# Patient Record
Sex: Female | Born: 1995 | Race: Black or African American | Hispanic: No | Smoking: Current every day smoker
Health system: Southern US, Community
[De-identification: ages and names within clinical notes are randomized; demographics above are authoritative.]

## PROBLEM LIST (undated history)

## (undated) DIAGNOSIS — J45909 Unspecified asthma, uncomplicated: Secondary | ICD-10-CM

## (undated) DIAGNOSIS — R569 Unspecified convulsions: Secondary | ICD-10-CM

## (undated) HISTORY — PX: NASAL POLYP SURGERY: SHX186

---

## 2014-01-10 ENCOUNTER — Encounter (HOSPITAL_COMMUNITY): Payer: Self-pay | Admitting: Emergency Medicine

## 2014-01-10 ENCOUNTER — Emergency Department (HOSPITAL_COMMUNITY)
Admission: EM | Admit: 2014-01-10 | Discharge: 2014-01-10 | Disposition: A | Discharge status: Home / Self Care | Attending: Emergency Medicine | Admitting: Emergency Medicine

## 2014-01-10 DIAGNOSIS — F411 Generalized anxiety disorder: Secondary | ICD-10-CM | POA: Insufficient documentation

## 2014-01-10 DIAGNOSIS — Z79899 Other long term (current) drug therapy: Secondary | ICD-10-CM | POA: Insufficient documentation

## 2014-01-10 DIAGNOSIS — J45901 Unspecified asthma with (acute) exacerbation: Secondary | ICD-10-CM | POA: Insufficient documentation

## 2014-01-10 MED ORDER — PREDNISONE 20 MG PO TABS: ORAL_TABLET | ORAL | Status: DC

## 2014-01-10 MED ORDER — PREDNISONE 20 MG PO TABS
60.0000 mg | ORAL_TABLET | Freq: Once | ORAL | Status: AC
  Administered 2014-01-10: 60 mg via ORAL
  Filled 2014-01-10: qty 3

## 2014-01-10 MED ORDER — IPRATROPIUM BROMIDE 0.02 % IN SOLN
0.5000 mg | Freq: Once | RESPIRATORY_TRACT | Status: AC
  Administered 2014-01-10: 0.5 mg via RESPIRATORY_TRACT
  Filled 2014-01-10: qty 2.5

## 2014-01-10 MED ORDER — ALBUTEROL SULFATE HFA 108 (90 BASE) MCG/ACT IN AERS: 2.0000 | INHALATION_SPRAY | RESPIRATORY_TRACT | Status: DC | PRN

## 2014-01-10 MED ORDER — ALBUTEROL SULFATE (2.5 MG/3ML) 0.083% IN NEBU
5.0000 mg | INHALATION_SOLUTION | Freq: Once | RESPIRATORY_TRACT | Status: AC
  Administered 2014-01-10: 5 mg via RESPIRATORY_TRACT
  Filled 2014-01-10: qty 6

## 2014-01-10 NOTE — ED Provider Notes (Signed)
CSN: [id number]     Arrival date & time 01/10/14  2212 History   First MD Initiated Contact with Patient 01/10/14 2242     Chief Complaint  Patient presents with  . Shortness of Breath  . Chest Pain  . Anxiety     (Consider location/radiation/quality/duration/timing/severity/associated sxs/prior Treatment) HPI 18 year old female chronic cough chronic wheezing but worse the last week or 2 ran out of her inhaler but still has her nebulizer; it has been several months if not longer since the patient was last on oral steroids; she is no fever no chest pain no abdominal pain no vomiting or diarrhea no rash no trauma no other concerns. Treatment prior to arrival consisted occasional albuterol nebulizer. Last nebulizer was today. She does feel as if she needs another albuterol treatment now. History reviewed. No pertinent past medical history. History reviewed. No pertinent past surgical history. No family history on file. History  Substance Use Topics  . Smoking status: Never Smoker   . Smokeless tobacco: Not on file  . Alcohol Use: No   OB History   Grav Para Term Preterm Abortions TAB SAB Ect Mult Living                 Review of Systems 10 Systems reviewed and are negative for acute change except as noted in the HPI.   Allergies  Review of patient's allergies indicates no known allergies.  Home Medications   Current Outpatient Rx  Name  Route  Sig  Dispense  Refill  . albuterol (PROVENTIL HFA;VENTOLIN HFA) 108 (90 BASE) MCG/ACT inhaler   Inhalation   Inhale 1-2 puffs into the lungs every 6 (six) hours as needed for wheezing or shortness of breath.         Marland Kitchen. albuterol (PROVENTIL) (2.5 MG/3ML) 0.083% nebulizer solution   Nebulization   Take 2.5 mg by nebulization every 6 (six) hours as needed for wheezing or shortness of breath.         Marland Kitchen. ibuprofen (ADVIL,MOTRIN) 200 MG tablet   Oral   Take 200 mg by mouth daily as needed for mild pain.         Marland Kitchen. albuterol  (PROVENTIL HFA;VENTOLIN HFA) 108 (90 BASE) MCG/ACT inhaler   Inhalation   Inhale 2 puffs into the lungs every 2 (two) hours as needed for wheezing or shortness of breath (cough).   1 Inhaler   0   . predniSONE (DELTASONE) 20 MG tablet      2 tabs po daily x 4 days   8 tablet   0    BP 109/70  Pulse 82  Temp(Src) 97.9 F (36.6 C) (Oral)  Resp 16  SpO2 99% Physical Exam  Nursing note and vitals reviewed. Constitutional:  Awake, alert, nontoxic appearance.  HENT:  Head: Atraumatic.  Eyes: Right eye exhibits no discharge. Left eye exhibits no discharge.  Neck: Neck supple.  Cardiovascular: Normal rate and regular rhythm.   No murmur heard. Pulmonary/Chest: She is in respiratory distress. She has wheezes. She has no rales. She exhibits no tenderness.  Diffuse wheezes; patient speaks full sentences with pulse oximetry normal on room air at 95%; mild respiratory distress; no retractions no accessory muscle usage  Abdominal: Soft. She exhibits no distension. There is no tenderness. There is no rebound and no guarding.  Musculoskeletal: She exhibits no edema and no tenderness.  Baseline ROM, no obvious new focal weakness.  Neurological:  Mental status and motor strength appears baseline for patient and situation.  Skin: No rash noted.  Psychiatric: She has a normal mood and affect.    ED Course  Procedures (including critical care time) Patient informed of clinical course, understand medical decision-making process, and agree with plan. Labs Review Labs Reviewed - No data to display Imaging Review No results found.   EKG Interpretation    Date/Time:  Monday January 10 2014 22:23:23 EST Ventricular Rate:  101 PR Interval:  162 QRS Duration: 66 QT Interval:  326 QTC Calculation: 422 R Axis:   84 Text Interpretation:  Poor data quality, interpretation may be adversely affected Sinus tachycardia Otherwise normal ECG No previous ECGs available Confirmed by [facility]  MD,  Natash Berman ([id number]) on 01/10/2014 11:04:09 PM            MDM   Final diagnoses:  Asthma exacerbation    I doubt any other EMC precluding discharge at this time including, but not necessarily limited to the following:ACS, SBI.    Hurman Horn, MD 01/12/14 [phone]

## 2014-01-10 NOTE — ED Notes (Signed)
Pt reports that she is in town from [place]. Her and her girlfriend got an apartment but were kicked out of their lease today. States that they then tried to go to the bus station and "the taxi driver just kicked us out and it was freezing." Pt then found another cab who stated " the only where I can take you for free is the hospital". Pt tearful and upset. States she doesn't have anywhere to stay tonight. Pt also has history of asthma, reports she was unable to use neb tx. Pt has bilateral exp wheezing. NAD. Speaks in complete sentences

## 2014-01-10 NOTE — ED Notes (Signed)
Pt concerned about having a seizure. Pt takes keppra, states she has been compliant with medications. States hasn't had seizure in more then 3 months. MD Bednar at bedside evaluating.

## 2014-01-10 NOTE — ED Notes (Signed)
Pt and friend to waiting room to await taxi [facility].

## 2014-01-10 NOTE — ED Notes (Signed)
Pt presents to department for evaluation of SOB and midsternal chest pain. Onset tonight. Pt tearful and anxious upon arrival. States "I don't want to talk about it." denies SI/HI. 9/10 pain, increases with deep breathing. Respirations unlabored. Pt is alert and oriented x4.

## 2014-01-10 NOTE — Discharge Instructions (Signed)
Asthma, Adult °Asthma is a condition of the lungs in which the airways tighten and narrow. Asthma can make it hard to breathe. Asthma cannot be cured, but medicine and lifestyle changes can help control it. Asthma may be started (triggered) by: °· Animal skin flakes (dander). °· Dust. °· Cockroaches. °· Pollen. °· Mold. °· Smoke. °· Cleaning products. °· Hair sprays or aerosol sprays. °· Paint fumes or strong smells. °· Cold air, weather changes, and winds. °· Crying or laughing hard. °· Stress. °· Certain medicines or drugs. °· Foods, such as dried fruit, potato chips, and sparkling grape juice. °· Infections or conditions (colds, flu). °· Exercise. °· Certain medical conditions or diseases. °· Exercise or tiring activities. °HOME CARE  °· Take medicine as told by your doctor. °· Use a peak flow meter as told by your doctor. A peak flow meter is a tool that measures how well the lungs are working. °· Record and keep track of the peak flow meter's readings. °· Understand and use the asthma action plan. An asthma action plan is a written plan for taking care of your asthma and treating your attacks. °· To help prevent asthma attacks: °· Do not smoke. Stay away from secondhand smoke. °· Change your heating and air conditioning filter often. °· Limit your use of fireplaces and wood stoves. °· Get rid of pests (such as roaches and mice) and their droppings. °· Throw away plants if you see mold on them. °· Clean your floors. Dust regularly. Use cleaning products that do not smell. °· Have someone vacuum when you are not home. Use a vacuum cleaner with a HEPA filter if possible. °· Replace carpet with wood, tile, or vinyl flooring. Carpet can trap animal skin flakes and dust. °· Use allergy-proof pillows, mattress covers, and box spring covers. °· Wash bed sheets and blankets every week in hot water and dry them in a dryer. °· Use blankets that are made of polyester or cotton. °· Clean bathrooms and kitchens with bleach.  If possible, have someone repaint the walls in these rooms with mold-resistant paint. Keep out of the rooms that are being cleaned and painted. °· Wash hands often. °GET HELP IF: °· You have make a whistling sound when breaking (wheeze), have shortness of breath, or have a cough even if taking medicine to prevent attacks. °· The colored mucus you cough up (sputum) is thicker than usual. °· The colored mucus you cough up changes from clear or white to yellow, green, gray, or bloody. °· You have problems from the medicine you are taking such as: °· A rash. °· Itching. °· Swelling. °· Trouble breathing. °· You need reliever medicines more than 2 3 times a week. °· Your peak flow measurement is still at 50 79% of your personal best after following the action plan for 1 hour. °GET HELP RIGHT AWAY IF:  °· You seem to be worse and are not responding to medicine during an asthma attack. °· You are short of breath even at rest. °· You get short of breath when doing very little activity. °· You have trouble eating, drinking, or talking. °· You have chest pain. °· You have a fast heartbeat. °· Your lips or fingernails start to turn blue. °· You are lightheaded, dizzy, or faint. °· Your peak flow is less than 50% of your personal best. °· You have a fever or lasting symptoms for more than 2 3 days. °· You have a fever and your symptoms suddenly   get worse. °MAKE SURE YOU:  °· Understand these instructions. °· Will watch your condition. °· Will get help right away if you are not doing well or get worse. °Document Released: 04/29/2008 Document Revised: 09/01/2013 Document Reviewed: 06/10/2013 °ExitCare® Patient Information ©2014 [employer]. ° °Emergency Department Resource Guide °1) Find a Doctor and Pay Out of Pocket °Although you won't have to find out who is covered by your insurance plan, it is a good idea to ask around and get recommendations. You will then need to call the office and see if the doctor you have chosen will  accept you as a new patient and what types of options they offer for patients who are self-pay. Some doctors offer discounts or will set up payment plans for their patients who do not have insurance, but you will need to ask so you aren't surprised when you get to your appointment. ° °2) Contact Your Local Health Department °Not all health departments have doctors that can see patients for sick visits, but many do, so it is worth a call to see if yours does. If you don't know where your local health department is, you can check in your phone book. The CDC also has a tool to help you locate your state's health department, and many state websites also have listings of all of their local health departments. ° °3) Find a Walk-in Clinic °If your illness is not likely to be very severe or complicated, you may want to try a walk in clinic. These are popping up all over the country in pharmacies, drugstores, and shopping centers. They're usually staffed by nurse practitioners or physician assistants that have been trained to treat common illnesses and complaints. They're usually fairly quick and inexpensive. However, if you have serious medical issues or chronic medical problems, these are probably not your best option. ° °No Primary Care Doctor: °- Call [facility] at  832-8000 - they can help you locate a primary care doctor that  accepts your insurance, provides certain services, etc. °- Physician Referral Service- 1-800-533-3463 ° °Chronic Pain Problems: °Organization         Address  Phone   Notes  °[facility]  (336) 297-2271 Patients need to be referred by their primary care doctor.  ° °Medication Assistance: °Organization         Address  Phone   Notes  °[place] [facility] [address] °[place], [place] [address] (336) 641-8030 --Must be a resident of [place] °-- Must have NO insurance coverage whatsoever (no Medicaid/ Medicare, etc.) °-- The pt.  MUST have a primary care doctor that directs their care regularly and follows them in the community °  °[facility]  (866) 331-1348   °[employer]  (888) 892-1162   ° °Agencies that provide inexpensive medical care: °Organization         Address  Phone   Notes  °[facility]  (336) 832-8035   °[facility]    (336) 832-7272   °[facility] [address] °[place], [place] [address] (336) 832-4777   °[facility] [address], °[place] (336) 271-4999   °[facility]    (336) 373-0678   °[facility]    (336) 272-1050   °[facility] ° [address], [place] Phone:  (336) 832-4444, Fax:  (336) 832-4440 Hours of Operation:  9 am - 6 pm, M-F.    Also accepts Medicaid/Medicare and self-pay.  °[facility] ° [address], [place] Phone: (336) 832-3150, Fax: (336) 832-3151. Hours of Operation:  8:30 am - 5:30 pm, M-F.  Also accepts Medicaid and self-pay.  °[facility] [place] [address], [place] Phone: (336) 878-6027   °[facility] [address], [place], [place] (336)723-1848, Ext. 123 Mondays & Thursdays: 7-9 AM.  First 15 patients are seen on a first come, first serve basis. °  ° °Medicaid-accepting [place] Providers: ° °Organization         Address  Phone   Notes  °[facility] [address], [place] (336) 641-2100 Also accepts self-pay patients.  °[facility] [address], [place] ° (336) 856-9996   °[facility] [address], [place] (336) 288-8857   °[facility] [address] [place] [address], [place] (336) 299-7000   °Veita Bland [address], [place]  ° (336) 373-1557 Only accepts [place] Access Medicaid patients after they have their name applied to their card.  ° °Self-Pay (no insurance) in  [place]: ° °Organization         Address  Phone   Notes  °Sickle Cell Patients, [facility] [address], [place] (336) 832-1970   °[facility] [address], [place] (336) 832-4400   °[facility] ° [address], [place] (336) 992-4800   °[facility]/Dr. Osei-Bonsu ° [address] [place] [address], [place] or [address], [place] (336) 841-8500 Phone number for both [place] and [place] locations is the same.  °[facility] [address], [place] (336) 299-0000   °[facility] [address] [place] [address], [place] or [address] (336) 852-7530 °(336) 878-2260   °[facility] [address], [place] (336) 350-1642, phone; (336) 294-5005, fax Sees patients 1st and 3rd Saturday of every month.  Must not qualify for public or private insurance (i.e. Medicaid, Medicare, [place] Health Choice, Veterans' Benefits) • Household income should be no more than 200% of the poverty level •The clinic cannot treat you if you are pregnant or think you are pregnant • Sexually transmitted diseases are not treated at the clinic.  ° ° °Dental Care: °Organization         Address  Phone  Notes  °[facility] [address], [place] (336) 641-6152 Accepts children up to age 21 who are enrolled in Medicaid or [place] Health Choice; pregnant women with a Medicaid card; and children who have applied for Medicaid or [place] Health Choice, but were declined, whose parents can pay a reduced fee at time of service.  °[facility] [place]  [address], [place] (336) 641-7733 Accepts children up to age 21 who are enrolled in Medicaid or [place] Health Choice; pregnant women with a Medicaid card; and children who have applied for Medicaid or [place] Health Choice, but were declined, whose  parents can pay a reduced fee at time of service.  °[facility] ° [address], [place] (336) 641-4533 Patients are seen by appointment only. Walk-ins are not accepted. [facility] will see patients 18 years of age and older. °Monday - Tuesday (  8am-5pm) °Most Wednesdays (8:30-5pm) °$30 per visit, cash only  °[facility] ° [address], [place] (336) 641-4533 Patients are seen by appointment only. Walk-ins are not accepted. [facility] will see patients 18 years of age and older. °One Wednesday Evening (Monthly: Volunteer Based).  $30 per visit, cash only  °[facility]  (919) 537-3737 for adults; Children under age 4, call Graduate Pediatric Dentistry at (919) [phone]. Children aged 4-14, please call (919) [phone] to request a pediatric application. ° Dental services are provided in all areas of dental care including fillings, crowns and bridges, complete and partial dentures, implants, gum treatment, root canals, and extractions. Preventive care is also provided. Treatment is provided to both adults and children. °Patients are selected via a lottery and there is often a waiting list. °  °[facility] [address], °[place] ° (336) 763-8833 [email or web address] °  °[facility] [address], [place], [place] (336)723-1848, Ext. 123 Second and Fourth Thursday of each month, opens at 6:30 AM; Clinic ends at 9 AM.  Patients are seen on a first-come first-served basis, and a limited number are seen during each clinic.  ° °[facility] ° [address], [place], [place] (336) 723-7904   Eligibility Requirements °You must have lived in [place], [place], or [place] counties for at least the last three months. °  You cannot be eligible for state or federal sponsored healthcare insurance, including Veterans Administration, Medicaid, or Medicare. °  You generally cannot be eligible for  healthcare insurance through your employer.  °  How to apply: °Eligibility screenings are held every Tuesday and Wednesday afternoon from 1:00 pm until 4:00 pm. You do not need an appointment for the interview!  °[facility] [address], [place], [place] 336-631-2330   °[facility]  336-342-8273   °[facility]  336-703-3100   °[facility]  336-570-6415   ° °Behavioral Health Resources in the Community: °Intensive Outpatient Programs °Organization         Address  Phone  Notes  °[facility] [address], [place], [place] 336-878-6098   °[facility] [address], [place], [place] 336-832-9800   °[facility] [address], [place], [place] ° 336-882-2125   °[facility] [address],  °[place], [place] 1-800-853-5163 or 336-641-4981   °Substance Abuse Resources °Organization         Address  Phone  Notes  °[facility]  336-882-2125   °[facility]  336-784-9470   °[facility]  336-285-9073   °[facility]  336-845-3988   °Residential & Outpatient Substance Abuse Program  1-800-659-3381   °Psychological Services °Organization         Address  Phone  Notes  °[facility]  336- 832-9600   °[facility]  336- 378-7881   °[facility] [address], [place] 1-800-853-5163 or 336-641-4981   ° °Mobile Crisis Teams °Organization         Address  Phone  Notes  °[facility], Mobile Crisis Care Unit  1-877-626-1772   °Assertive °[facility] ° [address] [place], [place] 336-834-9664   °Sharon DeEsch [address] ° [place] 336-554-5454   ° °Self-Help/Support Groups °Organization         Address  Phone               Notes  °[facility] - variety of support groups  336- 373-1402 Call for more information  °Narcotics  Anonymous (NA), [facility] [address], °[place]  2 meetings at this location  ° °Residential Treatment Programs °Organization         Address  Phone  Notes  °[facility] [address],    °[place]  1-866-801-8205   °[facility] ° [address], [place], [place] 704-293-8524   °[facility] [address], [place] 336-845-3988 Admissions: 8am-3pm M-F  °[facility] [address]    °[place], [place] 336-841-1104   °[facility] [address], [place], [place] 336-379-7146   °[facility] [address]  °[place], [place] 336-285-9073   °[facility] Intensive Outpatient [address], [place], [place] 336-852-3033   °[facility] [address]  °[place], [place] 1-877-615-2722 or 336-784-9470   °[facility] [address] [place], [place] 336-227-7417 Accepts Medicaid  °[facility] [address]  °[place] 1-800-659-3381 Substance Abuse/Addiction Treatment  ° °[place] Behavioral Health Resources °Organization         Address  Phone  Notes  °[facility]  (888) 581-9988   °Julie Brannon, PhD [address] [place], [place]   (336) 349-5553 or (336) 951-0000   °[facility]   [address] °[place], [place] (336) 349-4454   °[facility] [address], [place], [place] (336) 342-8316 Insurance/Medicaid/sponsorship through [facility]  °[facility] [address]                                    [place], [place] (336) 342-8316 Therapy/tele-psych/case  °[facility] [address]  ° [place], [place] (336) 349-2233    °Dr. Arfeen  (336) 349-4544   °[facility] [place]  [employer] [place] [facility] 1) [address],  °2) [address], [place] °3)  [address], [place] (336) 349-3220 °(336)  342-7768 ° °(336) 342-8140   °[place] [facility] Abuse [facility] (336) 342-1394 or (336) 342-3537 (After Hours)    ° ° ° °

## 2014-01-28 ENCOUNTER — Emergency Department (HOSPITAL_COMMUNITY)
Admission: EM | Admit: 2014-01-28 | Discharge: 2014-01-28 | Disposition: A | Discharge status: Home / Self Care | Attending: Emergency Medicine | Admitting: Emergency Medicine

## 2014-01-28 ENCOUNTER — Encounter (HOSPITAL_COMMUNITY): Payer: Self-pay | Admitting: Emergency Medicine

## 2014-01-28 DIAGNOSIS — F17200 Nicotine dependence, unspecified, uncomplicated: Secondary | ICD-10-CM | POA: Insufficient documentation

## 2014-01-28 DIAGNOSIS — R079 Chest pain, unspecified: Secondary | ICD-10-CM

## 2014-01-28 DIAGNOSIS — Z79899 Other long term (current) drug therapy: Secondary | ICD-10-CM | POA: Insufficient documentation

## 2014-01-28 DIAGNOSIS — J45901 Unspecified asthma with (acute) exacerbation: Secondary | ICD-10-CM | POA: Insufficient documentation

## 2014-01-28 HISTORY — DX: Unspecified asthma, uncomplicated: J45.909

## 2014-01-28 MED ORDER — ALBUTEROL SULFATE HFA 108 (90 BASE) MCG/ACT IN AERS
2.0000 | INHALATION_SPRAY | RESPIRATORY_TRACT | Status: DC | PRN
  Administered 2014-01-28: 2 via RESPIRATORY_TRACT
  Filled 2014-01-28: qty 6.7

## 2014-01-28 MED ORDER — ALBUTEROL SULFATE (2.5 MG/3ML) 0.083% IN NEBU
5.0000 mg | INHALATION_SOLUTION | Freq: Once | RESPIRATORY_TRACT | Status: AC
  Administered 2014-01-28: 5 mg via RESPIRATORY_TRACT
  Filled 2014-01-28: qty 6

## 2014-01-28 NOTE — ED Notes (Signed)
C/o pain in center of chest and sob/wheezing x 2 hours.  States she lives in a shelter and has to be out of the shelter by a certain time.  States she was unable to get back in shelter to get her albuterol.

## 2014-01-28 NOTE — Discharge Instructions (Signed)
Asthma, Adult Asthma is a condition of the lungs in which the airways tighten and narrow. Asthma can make it hard to breathe. Asthma cannot be cured, but medicine and lifestyle changes can help control it. Asthma may be started (triggered) by:  Animal skin flakes (dander).  Dust.  Cockroaches.  Pollen.  Mold.  Smoke.  Cleaning products.  Hair sprays or aerosol sprays.  Paint fumes or strong smells.  Cold air, weather changes, and winds.  Crying or laughing hard.  Stress.  Certain medicines or drugs.  Foods, such as dried fruit, potato chips, and sparkling grape juice.  Infections or conditions (colds, flu).  Exercise.  Certain medical conditions or diseases.  Exercise or tiring activities. HOME CARE   Take medicine as told by your doctor.  Use a peak flow meter as told by your doctor. A peak flow meter is a tool that measures how well the lungs are working.  Record and keep track of the peak flow meter's readings.  Understand and use the asthma action plan. An asthma action plan is a written plan for taking care of your asthma and treating your attacks.  To help prevent asthma attacks:  Do not smoke. Stay away from secondhand smoke.  Change your heating and air conditioning filter often.  Limit your use of fireplaces and wood stoves.  Get rid of pests (such as roaches and mice) and their droppings.  Throw away plants if you see mold on them.  Clean your floors. Dust regularly. Use cleaning products that do not smell.  Have someone vacuum when you are not home. Use a vacuum cleaner with a HEPA filter if possible.  Replace carpet with wood, tile, or vinyl flooring. Carpet can trap animal skin flakes and dust.  Use allergy-proof pillows, mattress covers, and box spring covers.  Wash bed sheets and blankets every week in hot water and dry them in a dryer.  Use blankets that are made of polyester or cotton.  Clean bathrooms and kitchens with bleach.  If possible, have someone repaint the walls in these rooms with mold-resistant paint. Keep out of the rooms that are being cleaned and painted.  Wash hands often. GET HELP IF:  You have make a whistling sound when breaking (wheeze), have shortness of breath, or have a cough even if taking medicine to prevent attacks.  The colored mucus you cough up (sputum) is thicker than usual.  The colored mucus you cough up changes from clear or white to yellow, green, gray, or bloody.  You have problems from the medicine you are taking such as:  A rash.  Itching.  Swelling.  Trouble breathing.  You need reliever medicines more than 2 3 times a week.  Your peak flow measurement is still at 50 79% of your personal best after following the action plan for 1 hour. GET HELP RIGHT AWAY IF:   You seem to be worse and are not responding to medicine during an asthma attack.  You are short of breath even at rest.  You get short of breath when doing very little activity.  You have trouble eating, drinking, or talking.  You have chest pain.  You have a fast heartbeat.  Your lips or fingernails start to turn blue.  You are lightheaded, dizzy, or faint.  Your peak flow is less than 50% of your personal best.  You have a fever or lasting symptoms for more than 2 3 days.  You have a fever and your symptoms suddenly  get worse. MAKE SURE YOU:   Understand these instructions.  Will watch your condition.  Will get help right away if you are not doing well or get worse. Document Released: 04/29/2008 Document Revised: 09/01/2013 Document Reviewed: 06/10/2013 [facility] Patient Information 2014 [place].  Asthma Attack Prevention Although there is no way to prevent asthma from starting, you can take steps to control the disease and reduce its symptoms. Learn about your asthma and how to control it. Take an active role to control your asthma by working with your health care provider to  create and follow an asthma action plan. An asthma action plan guides you in:  Taking your medicines properly.  Avoiding things that set off your asthma or make your asthma worse (asthma triggers).  Tracking your level of asthma control.  Responding to worsening asthma.  Seeking emergency care when needed. To track your asthma, keep records of your symptoms, check your peak flow number using a handheld device that shows how well air moves out of your lungs (peak flow meter), and get regular asthma checkups.  WHAT ARE SOME WAYS TO PREVENT AN ASTHMA ATTACK?  Take medicines as directed by your health care provider.  Keep track of your asthma symptoms and level of control.  With your health care provider, write a detailed plan for taking medicines and managing an asthma attack. Then be sure to follow your action plan. Asthma is an ongoing condition that needs regular monitoring and treatment.  Identify and avoid asthma triggers. Many outdoor allergens and irritants (such as pollen, mold, cold air, and air pollution) can trigger asthma attacks. Find out what your asthma triggers are and take steps to avoid them.  Monitor your breathing. Learn to recognize warning signs of an attack, such as coughing, wheezing, or shortness of breath. Your lung function may decrease before you notice any signs or symptoms, so regularly measure and record your peak airflow with a home peak flow meter.  Identify and treat attacks early. If you act quickly, you are less likely to have a severe attack. You will also need less medicine to control your symptoms. When your peak flow measurements decrease and alert you to an upcoming attack, take your medicine as instructed and immediately stop any activity that may have triggered the attack. If your symptoms do not improve, get medical help.  Pay attention to increasing quick-relief inhaler use. If you find yourself relying on your quick-relief inhaler, your asthma is  not under control. See your health care provider about adjusting your treatment. WHAT CAN MAKE MY SYMPTOMS WORSE? A number of common things can set off or make your asthma symptoms worse and cause temporary increased inflammation of your airways. Keep track of your asthma symptoms for several weeks, detailing all the environmental and emotional factors that are linked with your asthma. When you have an asthma attack, go back to your asthma diary to see which factor, or combination of factors, might have contributed to it. Once you know what these factors are, you can take steps to control many of them. If you have allergies and asthma, it is important to take asthma prevention steps at home. Minimizing contact with the substance to which you are allergic will help prevent an asthma attack. Some triggers and ways to avoid these triggers are: Animal Dander:  Some people are allergic to the flakes of skin or dried saliva from animals with fur or feathers.   There is no such thing as a hypoallergenic dog  or cat breed. All dogs or cats can cause allergies, even if they don't shed.  Keep these pets out of your home.  If you are not able to keep a pet outdoors, keep the pet out of your bedroom and other sleeping areas at all times, and keep the door closed.  Remove carpets and furniture covered with cloth from your home. If that is not possible, keep the pet away from fabric-covered furniture and carpets. Dust Mites: Many people with asthma are allergic to dust mites. Dust mites are tiny bugs that are found in every home in mattresses, pillows, carpets, fabric-covered furniture, bedcovers, clothes, stuffed toys, and other fabric-covered items.   Cover your mattress in a special dust-proof cover.  Cover your pillow in a special dust-proof cover, or wash the pillow each week in hot water. Water must be hotter than 130 F (54.4 C) to kill dust mites. Cold or warm water used with detergent and bleach can  also be effective.  Wash the sheets and blankets on your bed each week in hot water.  Try not to sleep or lie on cloth-covered cushions.  Call ahead when traveling and ask for a smoke-free hotel room. Bring your own bedding and pillows in case the hotel only supplies feather pillows and down comforters, which may contain dust mites and cause asthma symptoms.  Remove carpets from your bedroom and those laid on concrete, if you can.  Keep stuffed toys out of the bed, or wash the toys weekly in hot water or cooler water with detergent and bleach. Cockroaches: Many people with asthma are allergic to the droppings and remains of cockroaches.   Keep food and garbage in closed containers. Never leave food out.  Use poison baits, traps, powders, gels, or paste (for example, boric acid).  If a spray is used to kill cockroaches, stay out of the room until the odor goes away. Indoor Mold:  Fix leaky faucets, pipes, or other sources of water that have mold around them.  Clean floors and moldy surfaces with a fungicide or diluted bleach.  Avoid using humidifiers, vaporizers, or swamp coolers. These can spread molds through the air. Pollen and Outdoor Mold:  When pollen or mold spore counts are high, try to keep your windows closed.  Stay indoors with windows closed from late morning to afternoon. Pollen and some mold spore counts are highest at that time.  Ask your health care provider whether you need to take anti-inflammatory medicine or increase your dose of the medicine before your allergy season starts. Other Irritants to Avoid:  Tobacco smoke is an irritant. If you smoke, ask your health care provider how you can quit. Ask family members to quit smoking too. Do not allow smoking in your home or car.  If possible, do not use a wood-burning stove, kerosene heater, or fireplace. Minimize exposure to all sources of smoke, including to incense, candles, fires, and fireworks.  Try to stay  away from strong odors and sprays, such as perfume, talcum powder, hair spray, and paints.  Decrease humidity in your home and use an indoor air cleaning device. Reduce indoor humidity to below 60%. Dehumidifiers or central air conditioners can do this.  Decrease house dust exposure by changing furnace and air cooler filters frequently.  Try to have someone else vacuum for you once or twice a week. Stay out of rooms while they are being vacuumed and for a short while afterward.  If you vacuum, use a dust mask from  a hardware store, a double-layered or microfilter vacuum cleaner bag, or a vacuum cleaner with a HEPA filter.  Sulfites in foods and beverages can be irritants. Do not drink beer or wine or eat dried fruit, processed potatoes, or shrimp if they cause asthma symptoms.  Cold air can trigger an asthma attack. Cover your nose and mouth with a scarf on cold or windy days.  Several health conditions can make asthma more difficult to manage, including a runny nose, sinus infections, reflux disease, psychological stress, and sleep apnea. Work with your health care provider to manage these conditions.  Avoid close contact with people who have a respiratory infection such as a cold or the flu, since your asthma symptoms may get worse if you catch the infection. Wash your hands thoroughly after touching items that may have been handled by people with a respiratory infection.  Get a flu shot every year to protect against the flu virus, which often makes asthma worse for days or weeks. Also get a pneumonia shot if you have not previously had one. Unlike the flu shot, the pneumonia shot does not need to be given yearly. Medicines:  Talk to your health care provider about whether it is safe for you to take aspirin or non-steroidal anti-inflammatory medicines (NSAIDs). In a small number of people with asthma, aspirin and NSAIDs can cause asthma attacks. These medicines must be avoided by people who  have known aspirin-sensitive asthma. It is important that people with aspirin-sensitive asthma read labels of all over-the-counter medicines used to treat pain, colds, coughs, and fever.  Beta blockers and ACE inhibitors are other medicines you should discuss with your health care provider. HOW CAN I FIND OUT WHAT I AM ALLERGIC TO? Ask your asthma health care provider about allergy skin testing or blood testing (the RAST test) to identify the allergens to which you are sensitive. If you are found to have allergies, the most important thing to do is to try to avoid exposure to any allergens that you are sensitive to as much as possible. Other treatments for allergies, such as medicines and allergy shots (immunotherapy) are available.  CAN I EXERCISE? Follow your health care provider's advice regarding asthma treatment before exercising. It is important to maintain a regular exercise program, but vigorous exercise, or exercise in cold, humid, or dry environments can cause asthma attacks, especially for those people who have exercise-induced asthma. Document Released: 10/30/2009 Document Revised: 07/14/2013 Document Reviewed: 05/19/2013 [facility] Patient Information 2014 [place], [place].  Emergency Department Resource Guide 1) Find a Doctor and Pay Out of Pocket Although you won't have to find out who is covered by your insurance plan, it is a good idea to ask around and get recommendations. You will then need to call the office and see if the doctor you have chosen will accept you as a new patient and what types of options they offer for patients who are self-pay. Some doctors offer discounts or will set up payment plans for their patients who do not have insurance, but you will need to ask so you aren't surprised when you get to your appointment.  2) Contact Your Local Health Department Not all health departments have doctors that can see patients for sick visits, but many do, so it is worth a call to  see if yours does. If you don't know where your local health department is, you can check in your phone book. The CDC also has a tool to help you locate your state's  health department, and many state websites also have listings of all of their local health departments.  3) Find a Walk-in Clinic If your illness is not likely to be very severe or complicated, you may want to try a walk in clinic. These are popping up all over the country in pharmacies, drugstores, and shopping centers. They're usually staffed by nurse practitioners or physician assistants that have been trained to treat common illnesses and complaints. They're usually fairly quick and inexpensive. However, if you have serious medical issues or chronic medical problems, these are probably not your best option.  No Primary Care Doctor: - Call Health Connect at  [phone] - they can help you locate a primary care doctor that  accepts your insurance, provides certain services, etc. - Physician Referral Service- [phone]  Chronic Pain Problems: Organization         Address  Phone   Notes  [facility]  [phone] Patients need to be referred by their primary care doctor.   Medication Assistance: Organization         Address  Phone   Notes  [facility]ication [facility] [address] [place], [place] [address] [phone] --Must be a resident of [place] -- Must have NO insurance coverage whatsoever (no Medicaid/ Medicare, etc.) -- The pt. MUST have a primary care doctor that directs their care regularly and follows them in the community   [facility]  [phone]   [employer]  [phone]    Agencies that provide inexpensive medical care: Organization         Address                                                       Phone                                                                            Notes  [facility]  [phone]   [facility]    [phone]   [facility] [address] [place], [place] [address] [phone]   [facility] [address], [place] [phone]   [facility]    [phone]   [facility]    [phone]) [phone]   [facility]  [address], [place] Phone:  [phone], Fax:  [phone] Hours of Operation:  9 am - 6 pm, M-F.  Also accepts Medicaid/Medicare and self-pay.  [facility]  [address], [place] Phone: [phone], Fax: [phone]. Hours of Operation:  8:30 am - 5:30 pm, M-F.  Also accepts Medicaid and self-pay.  [facility] [place] [address], [place] Phone: [phone]   [facility] [address], [place], [place] [phone], Ext. [phone] Mondays & Thursdays: 7-9 AM.  First 15 patients are seen on a first come, first serve basis.    Medicaid-accepting  [facility] Providers:  Organization         Address                                                                       Phone                               Notes  [facility] [address], [place] [phone] Also accepts self-pay patients.  [facility] [address] Laurell Josephs [place], [place]  [phone]   [facility] [address], [place] [phone]   [facility] [address], [place] [phone]   Renaye Rakers [address], [place]   [phone] Only accepts [place] patients after they have their name applied to their card.   Self-Pay (no insurance) in [facility]:   Organization         Address                                                     Phone               Notes  Sickle Cell Patients, [facility] [address] [place], [place] [phone]     [facility] [address] [place], [place] [phone]   [facility] [place]  [address], [place] [phone]   [facility]/Dr. Osei-Bonsu  [address] [place] or [address], [place] [phone] Phone number for both [place] and [place] locations is the same.  [facility] [address], [place] [phone]   [facility] [address] [place] or [address] [phone]   [facility] [address], [place] [phone], phone; [phone], fax Sees patients 1st and 3rd Saturday of every month.  Must not qualify for public or private insurance (i.e. Medicaid, Medicare, [place] Health Choice, Veterans' Benefits)  Household income should be no more than 200% of the poverty level The clinic cannot treat you if you are pregnant or think you are pregnant  Sexually transmitted diseases are not treated at the clinic.    Dental Care: Organization         Address                                  Phone                       Notes  [facility] [address] [place], [place] [phone] Accepts children up to age 65 who are enrolled in [place] or [place] Health Choice; pregnant women with a Medicaid card; and children who  have applied for Medicaid or [place] Health Choice, but were declined, whose parents can pay a reduced fee at time of service.  [facility]  [address], [place] [phone] Accepts children up to age 80 who are enrolled in [place] or [place] Health Choice; pregnant women with a Medicaid card; and children who have applied for Medicaid or [place] Health Choice, but were declined, whose parents can pay a reduced fee at time of service.  [facility]  [address]  [place], [place] [phone] Patients are seen by appointment only. Walk-ins are not accepted. [facility] will see patients 28 years of age and older. Monday - Tuesday (8am-5pm) Most Wednesdays (8:30-5pm) $30 per visit, cash only  [facility]  [address], [facility] [phone] Patients are seen by appointment only. Walk-ins are not accepted. [facility] will see patients 58 years of age and older. One Wednesday Evening (Monthly: Volunteer Based).  $30 per visit, cash only  [employer]  [phone] for adults; Children under age 72, call Graduate Pediatric Dentistry at [phone]. Children aged 40-14, please call [phone] to request a pediatric application.  Dental services are provided in all areas of dental care including fillings, crowns and bridges, complete and partial dentures, implants, gum treatment, root canals, and extractions. Preventive care is also provided. Treatment is provided to both adults and children. Patients are selected via a lottery and there is often a waiting list.   [facility] [address], [place]  [phone] [email or web address]   [facility] [address] [place], [place] [phone], Ext. [phone] Second and Fourth Thursday of each month, opens at 6:30 AM; Clinic ends at 9 AM.  Patients are seen on a first-come first-served basis, and a limited number are seen during each clinic.   [facility]  [address] [place]ins [place], [place] [phone]   Eligibility Requirements You must have lived in [place], [place], or [place] counties for at least the last three months.   You cannot be eligible for state or federal sponsored [place], including [employer], [place], or [employer].   You generally cannot be eligible for healthcare insurance through your employer.    How to apply: Eligibility screenings are held every Tuesday and  Wednesday afternoon from 1:00 pm until 4:00 pm. You do not need an appointment for the interview!  [facility] [address], [place], [place] [phone]   [facility]  [phone]   [facility]  [phone]   [facility]  [phone]    Behavioral Health Resources in the Community: Intensive Outpatient Programs Organization         Address                                              Phone              Notes  [facility] [address] [place], [place] [phone]   [facility] [address] [place], [place] [phone]   [facility] [address] [place], [place]  [phone]   [facility] [address],  [place], [place] [phone] or [phone]   Substance Abuse Resources Organization         Address                                Phone  Notes  [facility]  [phone]   [facility]  [phone]   [facility]  [phone]   Floydene Flock  [phone]   Residential & Outpatient Substance Abuse Program  [phone]   Psychological Services Organization         Address                                  Phone                Notes  [facility]  [phone]   [facility]  [phone]   [facility] [address] [place] [phone] or [phone]    Mobile Crisis Teams Organization         Address  Phone  Notes  [facility]rapeutic [facility], Mobile Crisis Care Unit  [phone]   [facility]  [address] [place], [place] [phone]   Doristine Locks [address] [place] [phone]    Self-Help/Support Groups Organization         Address                         Phone             Notes  [facility] [place] - variety of support groups  336- [phone]  Call for more information  Narcotics Anonymous (NA), [facility] [address], [employer] [place]  2 meetings at this location   Statistician         Address                                                    Phone              Notes  [facility] [address],    [place]  [phone]   [facility]  [address] [place] [address], [place], [place] [phone]   [facility] [address] [place], [place] [phone] Admissions: 8am-3pm M-F  [facility] [address]    [place], [place] [phone]   [facility] [address] [place], [place], [place] [phone]   [facility] [address],  [place], [place] [phone]   [facility] Intensive Outpatient [address], [place], [place] [phone]   [facility] ([facility] [address] [place]  [place], [place] [phone] or [phone]   [facility] [address] [place], [place] [phone] Accepts Medicaid  [facility] [address]  [place] [phone] Substance Abuse/Addiction Treatment   [facility] Resources Organization         Address  Phone                    Notes  [facility]  [phone]   Angie Fava, PhD [address] [place], [place]   [phone] or [phone]   [facility]   [address] [place], [place] [phone]   [facility] [address] [place], [place] [phone] Insurance/Medicaid/sponsorship through [facility] and Families [address]                                    [place], [place] [phone] Therapy/tele-psych/case  [facility] [address], [place] [phone]    Dr. Lolly Mustache  [phone]   [facility]  [employer] [facility] Dept. 1) [address] [place] 2) [address], [place] 3)  [address], [place] [phone]  [phone]   [facility] Child Abuse Hotline [phone] or [phone] (After Hours)

## 2014-01-28 NOTE — ED Notes (Signed)
Pt verbalized understanding of discharge instruction. E-signature pad not working.

## 2014-01-28 NOTE — ED Provider Notes (Signed)
CSN: [id number]     Arrival date & time 01/28/14  2010 History  This chart was scribed for Junius FinnerErin O'Malley, PA, working with Shelda JakesScott W. Zackowski, MD, by Ardelia Memsylan [place] ED Scribe. This patient was seen in room TR09C/TR09C and the patient's care was started at 9:24 PM.  Chief Complaint  Patient presents with  . Chest Pain  . Asthma    The history is provided by the patient. No language interpreter was used.    HPI Comments: Ana Clarke is a 18 y.o. female with a history of asthma who presents to the Emergency Department complaining of "achy" center chest pain onset 2 hours ago, 2/10. She reports associated SOB and wheezing. She suspects that she was havng an asthma attack. She states that she has been prescribed an albuterol nebulizer but did not have it with her today. She reports that she was given a breathing treatment upon arrival which offered significant relief of her symptoms. She denies fever, nausea, vomiting or any other symptoms. Denies recent illness or sick contacts. No hx of CAD. No hx of blood clots, long travel, or birth control.    Past Medical History  Diagnosis Date  . Asthma    Past Surgical History  Procedure Laterality Date  . Nasal polyp surgery     No family history on file. History  Substance Use Topics  . Smoking status: Current Every Day Smoker  . Smokeless tobacco: Not on file  . Alcohol Use: Yes   OB History   Grav Para Term Preterm Abortions TAB SAB Ect Mult Living                 Review of Systems  Constitutional: Negative for fever.  Respiratory: Positive for chest tightness, shortness of breath and wheezing.   Gastrointestinal: Negative for nausea and vomiting.  All other systems reviewed and are negative.   Allergies  Review of patient's allergies indicates no known allergies.  Home Medications   Current Outpatient Rx  Name  Route  Sig  Dispense  Refill  . albuterol (PROVENTIL HFA;VENTOLIN HFA) 108 (90 BASE) MCG/ACT inhaler    Inhalation   Inhale 2 puffs into the lungs every 2 (two) hours as needed for wheezing or shortness of breath (cough).   1 Inhaler   0   . albuterol (PROVENTIL) (2.5 MG/3ML) 0.083% nebulizer solution   Nebulization   Take 2.5 mg by nebulization every 6 (six) hours as needed for wheezing or shortness of breath.          Triage Vitals: BP 106/66  Pulse 99  Temp(Src) 99.6 F (37.6 C) (Oral)  Resp 18  Ht 5\' 7"  (1.702 m)  Wt 125 lb (56.7 kg)  BMI 19.57 kg/m2  SpO2 100%  LMP 01/12/2014  Physical Exam  Nursing note and vitals reviewed. Constitutional: She appears well-developed and well-nourished. No distress.  HENT:  Head: Normocephalic and atraumatic.  Eyes: Conjunctivae are normal. No scleral icterus.  Neck: Normal range of motion.  Cardiovascular: Normal rate, regular rhythm and normal heart sounds.   Pulmonary/Chest: Effort normal and breath sounds normal. No respiratory distress. She has no wheezes. She has no rales. She exhibits no tenderness.  No respiratory distress. Able to speak in full sentences. Lungs CTA bilaterally.   Abdominal: Soft. Bowel sounds are normal. She exhibits no distension and no mass. There is no tenderness. There is no rebound and no guarding.  Soft, non-tender.  Musculoskeletal: Normal range of motion.  Neurological: She is alert.  Skin: Skin is warm and dry. She is not diaphoretic.    ED Course  Procedures (including critical care time)  DIAGNOSTIC STUDIES: Oxygen Saturation is 100% on RA, normal by my interpretation.    COORDINATION OF CARE: 9:27 PM- Pt advised of plan for treatment and pt agrees.  Medications  albuterol (PROVENTIL HFA;VENTOLIN HFA) 108 (90 BASE) MCG/ACT inhaler 2 puff (2 puffs Inhalation Given 01/28/14 2131)  albuterol (PROVENTIL) (2.5 MG/3ML) 0.083% nebulizer solution 5 mg (5 mg Nebulization Given 01/28/14 2025)   Labs Review Labs Reviewed - No data to display Imaging Review No results found.   EKG  Interpretation None      MDM   Final diagnoses:  Asthma exacerbation  Chest pain    Pt c/o asthma exacerbation. States she did not have her albuterol with her.  Pt given albuterol neb tx in triage which provided significant relief.  Vitals: O2-100% on RA.  Pt appears well, no respiratory distress. Lungs: CTAB.  Do not believe CXR or cardiac workup would be beneficial at this time. Not concerned for ACS, PE, pneumothorax, or pneumonia.  Rx: albuterol inhaler. Advised to f/u with PCP. Resource guide provided. Return precautions provided. Pt verbalized understanding and agreement with tx plan.   I personally performed the services described in this documentation, which was scribed in my presence. The recorded information has been reviewed and is accurate.    Junius Finner, PA-C 01/28/14 2252

## 2014-01-29 NOTE — ED Provider Notes (Signed)
Medical screening examination/treatment/procedure(s) were performed by non-physician practitioner and as supervising physician I was immediately available for consultation/collaboration.   EKG Interpretation None        Shelda JakesScott W. Arabela Basaldua, MD 01/29/14 [phone]

## 2014-02-14 ENCOUNTER — Emergency Department (HOSPITAL_COMMUNITY)
Admission: EM | Admit: 2014-02-14 | Discharge: 2014-02-14 | Disposition: A | Discharge status: Home / Self Care | Payer: TRICARE For Life (TFL) | Attending: Emergency Medicine | Admitting: Emergency Medicine

## 2014-02-14 ENCOUNTER — Encounter (HOSPITAL_COMMUNITY): Payer: Self-pay | Admitting: Emergency Medicine

## 2014-02-14 DIAGNOSIS — Z8669 Personal history of other diseases of the nervous system and sense organs: Secondary | ICD-10-CM | POA: Insufficient documentation

## 2014-02-14 DIAGNOSIS — Z79899 Other long term (current) drug therapy: Secondary | ICD-10-CM | POA: Insufficient documentation

## 2014-02-14 DIAGNOSIS — R0789 Other chest pain: Secondary | ICD-10-CM

## 2014-02-14 DIAGNOSIS — F17200 Nicotine dependence, unspecified, uncomplicated: Secondary | ICD-10-CM | POA: Insufficient documentation

## 2014-02-14 DIAGNOSIS — M940 Chondrocostal junction syndrome [Tietze]: Secondary | ICD-10-CM | POA: Insufficient documentation

## 2014-02-14 DIAGNOSIS — J45909 Unspecified asthma, uncomplicated: Secondary | ICD-10-CM | POA: Insufficient documentation

## 2014-02-14 DIAGNOSIS — IMO0002 Reserved for concepts with insufficient information to code with codable children: Secondary | ICD-10-CM | POA: Insufficient documentation

## 2014-02-14 HISTORY — DX: Unspecified convulsions: R56.9

## 2014-02-14 MED ORDER — NAPROXEN 500 MG PO TABS: 500.0000 mg | ORAL_TABLET | Freq: Two times a day (BID) | ORAL | Status: DC

## 2014-02-14 MED ORDER — KETOROLAC TROMETHAMINE 60 MG/2ML IM SOLN
60.0000 mg | Freq: Once | INTRAMUSCULAR | Status: AC
  Administered 2014-02-14: 60 mg via INTRAMUSCULAR
  Filled 2014-02-14: qty 2

## 2014-02-14 NOTE — ED Notes (Signed)
Pt given information for private MD, has [employer]

## 2014-02-14 NOTE — ED Provider Notes (Signed)
CSN: [id number]     Arrival date & time 02/14/14  [id number] History   First MD Initiated Contact with Patient 02/14/14 [phone]     Chief Complaint  Patient presents with  . Chest Pain     (Consider location/radiation/quality/duration/timing/severity/associated sxs/prior Treatment) HPI Comments: Patient is a 18 year old female with past medical history of asthma and seizures who presents to the emergency department complaining of midsternal chest pain free across both sides of her chest x 5 days. Pain is constant with intermittent sharp pains, worse with palpation and deep inspiration. Symptoms worse while she takes her nebulizer breathing treatments. Denies wheezing, cough or shortness of breath. He had a recent asthma exacerbation earlier this month. Denies fever or chills.  Patient is a 18 y.o. female presenting with chest pain. The history is provided by the patient.  Chest Pain   Past Medical History  Diagnosis Date  . Asthma   . Seizures    Past Surgical History  Procedure Laterality Date  . Nasal polyp surgery     No family history on file. History  Substance Use Topics  . Smoking status: Current Every Day Smoker  . Smokeless tobacco: Not on file  . Alcohol Use: Yes   OB History   Grav Para Term Preterm Abortions TAB SAB Ect Mult Living                 Review of Systems  Cardiovascular: Positive for chest pain.  All other systems reviewed and are negative.      Allergies  Review of patient's allergies indicates no known allergies.  Home Medications   Current Outpatient Rx  Name  Route  Sig  Dispense  Refill  . albuterol (PROVENTIL HFA;VENTOLIN HFA) 108 (90 BASE) MCG/ACT inhaler   Inhalation   Inhale 2 puffs into the lungs every 2 (two) hours as needed for wheezing or shortness of breath (cough).   1 Inhaler   0   . albuterol (PROVENTIL) (2.5 MG/3ML) 0.083% nebulizer solution   Nebulization   Take 2.5 mg by nebulization every 6 (six) hours as needed for  wheezing or shortness of breath.         . predniSONE (STERAPRED UNI-PAK) 5 MG TABS tablet   Oral   Take 5-20 mg by mouth taper from 4 doses each day to 1 dose and stop.         . naproxen (NAPROSYN) 500 MG tablet   Oral   Take 1 tablet (500 mg total) by mouth 2 (two) times daily.   30 tablet   0    BP 111/72  Pulse 96  Temp(Src) 97.6 F (36.4 C) (Oral)  Resp 18  Ht 5\' 7"  (1.702 m)  Wt 119 lb (53.978 kg)  BMI 18.63 kg/m2  SpO2 98%  LMP 02/09/2014 Physical Exam  Nursing note and vitals reviewed. Constitutional: She is oriented to person, place, and time. She appears well-developed and well-nourished. No distress.  HENT:  Head: Normocephalic and atraumatic.  Mouth/Throat: Oropharynx is clear and moist.  Eyes: Conjunctivae are normal.  Neck: Normal range of motion. Neck supple.  Cardiovascular: Normal rate, regular rhythm, normal heart sounds and intact distal pulses.   Pulmonary/Chest: Effort normal and breath sounds normal. No respiratory distress. She has no wheezes. She has no rales. She exhibits tenderness (generalized tenderness across chest).  Abdominal: Soft. Bowel sounds are normal. There is no tenderness.  Musculoskeletal: Normal range of motion. She exhibits no edema.  Neurological: She is alert and oriented  to person, place, and time.  Skin: Skin is warm and dry. No rash noted. She is not diaphoretic.  Psychiatric: She has a normal mood and affect. Her behavior is normal.    ED Course  Procedures (including critical care time) Labs Review Labs Reviewed - No data to display Imaging Review No results found.   EKG Interpretation None      MDM   Final diagnoses:  Costochondritis  Chest wall pain    Patient presenting with reproducible chest pain. She is well appearing and in no apparent distress. Had a recent asthma exacerbation earlier this month. Denies cough, wheezing, shortness of breath or fever. Lungs clear. Chest with generalized tenderness.  Doubt cardiac in origin, low risk. PERC negative. Stable for discharge, Toradol given in the emergency department, prescription for naproxen at discharge. Followup with primary care physician. Return precautions given. Patient states understanding of treatment care plan and is agreeable.   Trevor Mace, PA-C 02/14/14 [phone]

## 2014-02-14 NOTE — Discharge Instructions (Signed)
Take naproxen daily as prescribed. Followup with your primary care physician later this week.   Costochondritis Costochondritis, sometimes called Tietze syndrome, is a swelling and irritation (inflammation) of the tissue (cartilage) that connects your ribs with your breastbone (sternum). It causes pain in the chest and rib area. Costochondritis usually goes away on its own over time. It can take up to 6 weeks or longer to get better, especially if you are unable to limit your activities. CAUSES  Some cases of costochondritis have no known cause. Possible causes include:  Injury (trauma).  Exercise or activity such as lifting.  Severe coughing. SIGNS AND SYMPTOMS  Pain and tenderness in the chest and rib area.  Pain that gets worse when coughing or taking deep breaths.  Pain that gets worse with specific movements. DIAGNOSIS  Your health care provider will do a physical exam and ask about your symptoms. Chest X-rays or other tests may be done to rule out other problems. TREATMENT  Costochondritis usually goes away on its own over time. Your health care provider may prescribe medicine to help relieve pain. HOME CARE INSTRUCTIONS   Avoid exhausting physical activity. Try not to strain your ribs during normal activity. This would include any activities using chest, abdominal, and side muscles, especially if heavy weights are used.  Apply ice to the affected area for the first 2 days after the pain begins.  Put ice in a plastic bag.  Place a towel between your skin and the bag.  Leave the ice on for 20 minutes, 2 3 times a day.  Only take over-the-counter or prescription medicines as directed by your health care provider. SEEK MEDICAL CARE IF:  You have redness or swelling at the rib joints. These are signs of infection.  Your pain does not go away despite rest or medicine. SEEK IMMEDIATE MEDICAL CARE IF:   Your pain increases or you are very uncomfortable.  You have  shortness of breath or difficulty breathing.  You cough up blood.  You have worse chest pains, sweating, or vomiting.  You have a fever or persistent symptoms for more than 2 3 days.  You have a fever and your symptoms suddenly get worse. MAKE SURE YOU:   Understand these instructions.  Will watch your condition.  Will get help right away if you are not doing well or get worse. Document Released: 08/21/2005 Document Revised: 09/01/2013 Document Reviewed: 06/15/2013 [facility] Patient Information 2014 [place], [place].  Chest Wall Pain Chest wall pain is pain in or around the bones and muscles of your chest. It may take up to 6 weeks to get better. It may take longer if you must stay physically active in your work and activities.  CAUSES  Chest wall pain may happen on its own. However, it may be caused by:  A viral illness like the flu.  Injury.  Coughing.  Exercise.  Arthritis.  Fibromyalgia.  Shingles. HOME CARE INSTRUCTIONS   Avoid overtiring physical activity. Try not to strain or perform activities that cause pain. This includes any activities using your chest or your abdominal and side muscles, especially if heavy weights are used.  Put ice on the sore area.  Put ice in a plastic bag.  Place a towel between your skin and the bag.  Leave the ice on for 15-20 minutes per hour while awake for the first 2 days.  Only take over-the-counter or prescription medicines for pain, discomfort, or fever as directed by your caregiver. SEEK IMMEDIATE MEDICAL CARE  IF:   Your pain increases, or you are very uncomfortable.  You have a fever.  Your chest pain becomes worse.  You have new, unexplained symptoms.  You have nausea or vomiting.  You feel sweaty or lightheaded.  You have a cough with phlegm (sputum), or you cough up blood. MAKE SURE YOU:   Understand these instructions.  Will watch your condition.  Will get help right away if you are not doing well or  get worse. Document Released: 11/11/2005 Document Revised: 02/03/2012 Document Reviewed: 07/08/2011 [facility] Patient Information 2014 [place].

## 2014-02-14 NOTE — Discharge Planning (Signed)
[facility] Tivis RingerFelicia Clarke, Community Liaison  Clydie BraunKaren RN, asked me to speak to the patient about a primary care provider. Patient is new to the area and is not sure if she will be staying or relocating. I instructed patient that when she gets settled to call Ana Clarke and ask for a list of providers in her area that will accept her insurance. Resource guide given as well as my contact information for any future questions or concerns.

## 2014-02-14 NOTE — ED Provider Notes (Signed)
Medical screening examination/treatment/procedure(s) were performed by non-physician practitioner and as supervising physician I was immediately available for consultation/collaboration.  Muse not working: ECG: normal sinus rhythm, ventricular rate 90, normal axis, normal intervals, no acute ischemic changes noted, impression normal ECG, no comparison ECG available   Hurman HornJohn M Bond Grieshop, MD 02/14/14 Rickey [id number]

## 2014-02-14 NOTE — ED Notes (Addendum)
Pt states that she has had intermittent chest pain since [place] [facility] [place] [facility] [place] [place]-hx asthma, has had to use her nebulizer more often than normal since moving here. No acute resp distress.  States pain in rib cage and chest area with inspiration.

## 2014-02-14 NOTE — ED Notes (Signed)
Pt c/o center chest pain that radiates across chest onset Wednesday. Pt reports pain constant with intermittent sharp pain.

## 2014-05-11 ENCOUNTER — Emergency Department (HOSPITAL_COMMUNITY)
Admission: EM | Admit: 2014-05-11 | Discharge: 2014-05-11 | Disposition: A | Discharge status: Home / Self Care | Payer: TRICARE For Life (TFL) | Attending: Emergency Medicine | Admitting: Emergency Medicine

## 2014-05-11 ENCOUNTER — Encounter (HOSPITAL_COMMUNITY): Payer: Self-pay | Admitting: Emergency Medicine

## 2014-05-11 DIAGNOSIS — R0602 Shortness of breath: Secondary | ICD-10-CM | POA: Insufficient documentation

## 2014-05-11 DIAGNOSIS — J45909 Unspecified asthma, uncomplicated: Secondary | ICD-10-CM | POA: Insufficient documentation

## 2014-05-11 MED ORDER — IPRATROPIUM-ALBUTEROL 0.5-2.5 (3) MG/3ML IN SOLN
3.0000 mL | Freq: Once | RESPIRATORY_TRACT | Status: AC
  Administered 2014-05-11: 3 mL via RESPIRATORY_TRACT
  Filled 2014-05-11: qty 3

## 2014-05-11 NOTE — ED Notes (Signed)
Pt reports sob and cough x 1 week. Hx of asthma, no relief with meds and inhalers at home. spo2 91% and HR 138 at triage.

## 2014-05-11 NOTE — ED Notes (Signed)
NO answer x 1

## 2014-05-14 ENCOUNTER — Emergency Department (HOSPITAL_COMMUNITY)

## 2014-05-14 ENCOUNTER — Inpatient Hospital Stay (HOSPITAL_COMMUNITY)
Admission: EM | Admit: 2014-05-14 | Discharge: 2014-05-16 | DRG: 203 | Disposition: A | Discharge status: Home / Self Care | Attending: Internal Medicine | Admitting: Internal Medicine

## 2014-05-14 ENCOUNTER — Encounter (HOSPITAL_COMMUNITY): Payer: Self-pay | Admitting: Emergency Medicine

## 2014-05-14 DIAGNOSIS — J4531 Mild persistent asthma with (acute) exacerbation: Secondary | ICD-10-CM | POA: Diagnosis present

## 2014-05-14 DIAGNOSIS — Z79899 Other long term (current) drug therapy: Secondary | ICD-10-CM

## 2014-05-14 DIAGNOSIS — J4541 Moderate persistent asthma with (acute) exacerbation: Secondary | ICD-10-CM

## 2014-05-14 DIAGNOSIS — F1210 Cannabis abuse, uncomplicated: Secondary | ICD-10-CM | POA: Diagnosis present

## 2014-05-14 DIAGNOSIS — G40909 Epilepsy, unspecified, not intractable, without status epilepticus: Secondary | ICD-10-CM | POA: Diagnosis present

## 2014-05-14 DIAGNOSIS — F17200 Nicotine dependence, unspecified, uncomplicated: Secondary | ICD-10-CM | POA: Diagnosis present

## 2014-05-14 DIAGNOSIS — J45901 Unspecified asthma with (acute) exacerbation: Principal | ICD-10-CM | POA: Diagnosis present

## 2014-05-14 DIAGNOSIS — E876 Hypokalemia: Secondary | ICD-10-CM | POA: Diagnosis present

## 2014-05-14 LAB — BASIC METABOLIC PANEL
BUN: 8 mg/dL (ref 6–23)
CHLORIDE: 99 meq/L (ref 96–112)
CO2: 20 mEq/L (ref 19–32)
Calcium: 9.1 mg/dL (ref 8.4–10.5)
Creatinine, Ser: 0.76 mg/dL (ref 0.50–1.10)
GFR calc Af Amer: >90 mL/min (ref >90)
GFR calc non Af Amer: >90 mL/min (ref >90)
GLUCOSE: 135 mg/dL — AB (ref 70–99)
POTASSIUM: 3.3 meq/L — AB (ref 3.7–5.3)
Sodium: 138 mEq/L (ref 137–147)

## 2014-05-14 LAB — CBC WITH DIFFERENTIAL/PLATELET
Basophils Absolute: 0 10*3/uL (ref 0.0–0.1)
Basophils Relative: 0 % (ref 0–1)
Eosinophils Absolute: 0 10*3/uL (ref 0.0–0.7)
Eosinophils Relative: 0 % (ref 0–5)
HCT: 40.4 % (ref 36.0–46.0)
Hemoglobin: 13.4 g/dL (ref 12.0–15.0)
LYMPHS ABS: 0.6 10*3/uL — AB (ref 0.7–4.0)
Lymphocytes Relative: 8 % — ABNORMAL LOW (ref 12–46)
MCH: 26.4 pg (ref 26.0–34.0)
MCHC: 33.2 g/dL (ref 30.0–36.0)
MCV: 79.5 fL (ref 78.0–100.0)
MONOS PCT: 0 % — AB (ref 3–12)
Monocytes Absolute: 0 10*3/uL — ABNORMAL LOW (ref 0.1–1.0)
NEUTROS ABS: 7.2 10*3/uL (ref 1.7–7.7)
NEUTROS PCT: 92 % — AB (ref 43–77)
Platelets: 338 10*3/uL (ref 150–400)
RBC: 5.08 MIL/uL (ref 3.87–5.11)
RDW: 12.9 % (ref 11.5–15.5)
WBC: 7.9 10*3/uL (ref 4.0–10.5)

## 2014-05-14 MED ORDER — ENOXAPARIN SODIUM 40 MG/0.4ML ~~LOC~~ SOLN
40.0000 mg | SUBCUTANEOUS | Status: DC
  Administered 2014-05-14 – 2014-05-15 (×2): 40 mg via SUBCUTANEOUS
  Filled 2014-05-14 (×3): qty 0.4

## 2014-05-14 MED ORDER — ONDANSETRON HCL 4 MG/2ML IJ SOLN: 4.0000 mg | Freq: Four times a day (QID) | INTRAMUSCULAR | Status: DC | PRN

## 2014-05-14 MED ORDER — ACETAMINOPHEN 650 MG RE SUPP: 650.0000 mg | Freq: Four times a day (QID) | RECTAL | Status: DC | PRN

## 2014-05-14 MED ORDER — MAGNESIUM SULFATE 40 MG/ML IJ SOLN
2.0000 g | Freq: Once | INTRAMUSCULAR | Status: AC
  Administered 2014-05-14: 2 g via INTRAVENOUS
  Filled 2014-05-14 (×2): qty 50

## 2014-05-14 MED ORDER — LEVETIRACETAM 500 MG PO TABS
500.0000 mg | ORAL_TABLET | Freq: Two times a day (BID) | ORAL | Status: DC
  Administered 2014-05-14 – 2014-05-15 (×3): 500 mg via ORAL
  Filled 2014-05-14 (×5): qty 1

## 2014-05-14 MED ORDER — IPRATROPIUM BROMIDE 0.02 % IN SOLN: 0.5000 mg | RESPIRATORY_TRACT | Status: DC | PRN

## 2014-05-14 MED ORDER — ALBUTEROL SULFATE (2.5 MG/3ML) 0.083% IN NEBU
5.0000 mg | INHALATION_SOLUTION | RESPIRATORY_TRACT | Status: AC | PRN
  Filled 2014-05-14: qty 6

## 2014-05-14 MED ORDER — ONDANSETRON HCL 4 MG PO TABS: 4.0000 mg | ORAL_TABLET | Freq: Four times a day (QID) | ORAL | Status: DC | PRN

## 2014-05-14 MED ORDER — PNEUMOCOCCAL VAC POLYVALENT 25 MCG/0.5ML IJ INJ
0.5000 mL | INJECTION | INTRAMUSCULAR | Status: DC
  Filled 2014-05-14 (×2): qty 0.5

## 2014-05-14 MED ORDER — ONDANSETRON HCL 4 MG/2ML IJ SOLN
4.0000 mg | Freq: Once | INTRAMUSCULAR | Status: AC
  Administered 2014-05-14: 4 mg via INTRAVENOUS
  Filled 2014-05-14: qty 2

## 2014-05-14 MED ORDER — GUAIFENESIN ER 600 MG PO TB12
600.0000 mg | ORAL_TABLET | Freq: Two times a day (BID) | ORAL | Status: DC
  Administered 2014-05-14: 600 mg via ORAL
  Filled 2014-05-14 (×4): qty 1

## 2014-05-14 MED ORDER — MORPHINE SULFATE 4 MG/ML IJ SOLN
4.0000 mg | Freq: Once | INTRAMUSCULAR | Status: AC
  Administered 2014-05-14: 4 mg via INTRAVENOUS
  Filled 2014-05-14: qty 1

## 2014-05-14 MED ORDER — MAGNESIUM SULFATE 50 % IJ SOLN
2.0000 g | Freq: Once | INTRAMUSCULAR | Status: DC
  Filled 2014-05-14: qty 4

## 2014-05-14 MED ORDER — LEVALBUTEROL HCL 0.63 MG/3ML IN NEBU
0.6300 mg | INHALATION_SOLUTION | RESPIRATORY_TRACT | Status: DC
  Administered 2014-05-14 – 2014-05-16 (×9): 0.63 mg via RESPIRATORY_TRACT
  Filled 2014-05-14 (×16): qty 3

## 2014-05-14 MED ORDER — POTASSIUM CHLORIDE CRYS ER 20 MEQ PO TBCR
40.0000 meq | EXTENDED_RELEASE_TABLET | Freq: Once | ORAL | Status: AC
  Administered 2014-05-14: 40 meq via ORAL
  Filled 2014-05-14 (×2): qty 2

## 2014-05-14 MED ORDER — ALBUTEROL (5 MG/ML) CONTINUOUS INHALATION SOLN
10.0000 mg/h | INHALATION_SOLUTION | RESPIRATORY_TRACT | Status: AC
  Administered 2014-05-14: 10 mg/h via RESPIRATORY_TRACT

## 2014-05-14 MED ORDER — ALBUTEROL SULFATE (2.5 MG/3ML) 0.083% IN NEBU: 2.5000 mg | INHALATION_SOLUTION | RESPIRATORY_TRACT | Status: DC | PRN

## 2014-05-14 MED ORDER — METHYLPREDNISOLONE SODIUM SUCC 125 MG IJ SOLR
125.0000 mg | Freq: Once | INTRAMUSCULAR | Status: AC
  Administered 2014-05-14: 125 mg via INTRAVENOUS
  Filled 2014-05-14: qty 2

## 2014-05-14 MED ORDER — IPRATROPIUM BROMIDE 0.02 % IN SOLN
0.5000 mg | RESPIRATORY_TRACT | Status: DC
  Administered 2014-05-14 – 2014-05-16 (×9): 0.5 mg via RESPIRATORY_TRACT
  Filled 2014-05-14 (×9): qty 2.5

## 2014-05-14 MED ORDER — IBUPROFEN 800 MG PO TABS
800.0000 mg | ORAL_TABLET | Freq: Once | ORAL | Status: AC
  Administered 2014-05-14: 800 mg via ORAL
  Filled 2014-05-14: qty 1

## 2014-05-14 MED ORDER — ALBUTEROL (5 MG/ML) CONTINUOUS INHALATION SOLN
10.0000 mg/h | INHALATION_SOLUTION | RESPIRATORY_TRACT | Status: AC
  Administered 2014-05-14: 10 mg/h via RESPIRATORY_TRACT
  Filled 2014-05-14 (×2): qty 20

## 2014-05-14 MED ORDER — ACETAMINOPHEN 325 MG PO TABS
650.0000 mg | ORAL_TABLET | Freq: Four times a day (QID) | ORAL | Status: DC | PRN
  Administered 2014-05-14 – 2014-05-15 (×3): 650 mg via ORAL
  Filled 2014-05-14 (×3): qty 2

## 2014-05-14 MED ORDER — METHYLPREDNISOLONE SODIUM SUCC 125 MG IJ SOLR
80.0000 mg | Freq: Three times a day (TID) | INTRAMUSCULAR | Status: DC
  Administered 2014-05-14 – 2014-05-16 (×5): 80 mg via INTRAVENOUS
  Filled 2014-05-14 (×9): qty 1.28

## 2014-05-14 NOTE — ED Provider Notes (Signed)
Medical screening examination/treatment/procedure(s) were conducted as a shared visit with non-physician practitioner(s) and myself.  I personally evaluated the patient during the encounter.   EKG Interpretation None      18 year old female with history of asthma presenting with respiratory distress. On exam, tachypnea, mild respiratory distress, mild retractions, diffuse wheezing, heart sounds normal with tachycardic rate regular rhythm, abdomen soft and nontender. IV Solu-Medrol given prior to arrival. Plan continued albuterol treatments and reassessment.  Admitted.   Clinical Impression: 1. Asthma exacerbation   2. Hypokalemia   3. Asthma exacerbation attacks, moderate persistent       Candyce ChurnJohn David Monick Rena III, MD 05/15/14 1420

## 2014-05-14 NOTE — ED Notes (Signed)
Pt requesting Pain Meds - RN Vernona RiegerLaura aware

## 2014-05-14 NOTE — ED Provider Notes (Signed)
4:20 PM Assumed care of patient at change of shift.  Pt with hx asthma with two week asthma exacerbation.  Has had solu medrol and several neb treatments.  On arrival 90% on RA.  Currently 94% on RA but desats to 90%with ambulation, continued wheezing.  Will continue to treat with nebs, possible magnesium.  +/- admission depending on improvement.    5:18 PM Continued diffuse wheezing, chest tightness.  Multiple treatments here.  Mild improvement.   3 nebs tx at home, 2 nebs by fire dept, 2 nebs by EMS, 2 hour long treatments in ED.   Pt agrees to admission to hospital.   Admitted to [facility] hospitalist.    Results for orders placed during the hospital encounter of 05/14/14  CBC WITH DIFFERENTIAL      Result Value Ref Range   WBC 7.9  4.0 - 10.5 K/uL   RBC 5.08  3.87 - 5.11 MIL/uL   Hemoglobin 13.4  12.0 - 15.0 g/dL   HCT 09.840.4  11.936.0 - 14.746.0 %   MCV 79.5  78.0 - 100.0 fL   MCH 26.4  26.0 - 34.0 pg   MCHC 33.2  30.0 - 36.0 g/dL   RDW 82.912.9  56.211.5 - 13.015.5 %   Platelets 338  150 - 400 K/uL   Neutrophils Relative % 92 (*) 43 - 77 %   Neutro Abs 7.2  1.7 - 7.7 K/uL   Lymphocytes Relative 8 (*) 12 - 46 %   Lymphs Abs 0.6 (*) 0.7 - 4.0 K/uL   Monocytes Relative 0 (*) 3 - 12 %   Monocytes Absolute 0.0 (*) 0.1 - 1.0 K/uL   Eosinophils Relative 0  0 - 5 %   Eosinophils Absolute 0.0  0.0 - 0.7 K/uL   Basophils Relative 0  0 - 1 %   Basophils Absolute 0.0  0.0 - 0.1 K/uL  BASIC METABOLIC PANEL      Result Value Ref Range   Sodium 138  137 - 147 mEq/L   Potassium 3.3 (*) 3.7 - 5.3 mEq/L   Chloride 99  96 - 112 mEq/L   CO2 20  19 - 32 mEq/L   Glucose, Bld 135 (*) 70 - 99 mg/dL   BUN 8  6 - 23 mg/dL   Creatinine, Ser 8.650.76  0.50 - 1.10 mg/dL   Calcium 9.1  8.4 - 78.410.5 mg/dL   GFR calc non Af Amer >90  >90 mL/min   GFR calc Af Amer >90  >90 mL/min   Dg Chest 2 View  05/14/2014   CLINICAL DATA:  Wheezing  EXAM: CHEST  2 VIEW  COMPARISON:  None.  FINDINGS: Normal heart size and mediastinal contours.  No acute infiltrate or edema. No effusion or pneumothorax. No acute osseous findings.  IMPRESSION: No evidence of pneumonia or air leak.   Electronically Signed   By: Tiburcio PeaJonathan  Watts M.D.   On: 05/14/2014 13:14      Trixie Dredgemily West, PA-C 05/14/14 1946

## 2014-05-14 NOTE — ED Notes (Signed)
Bed: WA08 Expected date:  Expected time:  Means of arrival:  Comments: EMS-asthma 

## 2014-05-14 NOTE — ED Notes (Signed)
Pt tearful after receiving Morphine.  Pt states "it makes me feel weird."  Pt reassured that the feeling would pass.

## 2014-05-14 NOTE — ED Provider Notes (Signed)
CSN: [id number]     Arrival date & time 05/14/14  1234 History   First MD Initiated Contact with Patient 05/14/14 1242     Chief Complaint  Patient presents with  . Wheezing     (Consider location/radiation/quality/duration/timing/severity/associated sxs/prior Treatment) The history is provided by the patient and medical records. No language interpreter was used.    18 y/o femeale arrives via EMS for acute asthma exancerbation. Pateitn hjaas had w weeks of worsening sxs. She states that her sxs began ewith a [place]. She has been using her nebulizer witho only intermittent relief od athe sxs. The patient states that over the past 3 days she has had severly increased work of breathing. She has been she states that she had to use 8 neb treatments to get through her shift last night. Initial 02 sats were 90% on r/a. She received 10 mg albuterol, 0.5 of atrovent and 125 mg of solumedrol.  Denies fevers, chills, myalgias, arthralgias. History of pevious hospitalizations. No intubations.   Past Medical History  Diagnosis Date  . Asthma   . Seizures    Past Surgical History  Procedure Laterality Date  . Nasal polyp surgery     No family history on file. History  Substance Use Topics  . Smoking status: Current Every Day Smoker  . Smokeless tobacco: Not on file  . Alcohol Use: Yes   OB History   Grav Para Term Preterm Abortions TAB SAB Ect Mult Living                 Review of Systems  Ten systems reviewed and are negative for acute change, except as noted in the HPI.     Allergies  Review of patient's allergies indicates no known allergies.  Home Medications   Prior to Admission medications   Medication Sig Start Date End Date Taking? Authorizing Provider  albuterol (PROVENTIL HFA;VENTOLIN HFA) 108 (90 BASE) MCG/ACT inhaler Inhale 2 puffs into the lungs every 2 (two) hours as needed for wheezing or shortness of breath.   Yes Historical Provider, MD  guaiFENesin (MUCINEX) 600 MG  12 hr tablet Take 600 mg by mouth 2 (two) times daily as needed for cough or to loosen phlegm.   Yes Historical Provider, MD  levETIRAcetam (KEPPRA) 500 MG tablet Take 500 mg by mouth 2 (two) times daily.   Yes Historical Provider, MD  naproxen sodium (ANAPROX) 220 MG tablet Take 440 mg by mouth 2 (two) times daily as needed (pain).   Yes Historical Provider, MD   BP 133/90  Pulse 118  Temp(Src) 98.2 F (36.8 C) (Oral)  Resp 20  SpO2 94%  LMP 04/26/2014 Physical Exam  Nursing note and vitals reviewed. Constitutional: She is oriented to person, place, and time. She appears well-developed and well-nourished. She appears distressed.  HENT:  Head: Normocephalic and atraumatic.  Eyes: Conjunctivae and EOM are normal. Pupils are equal, round, and reactive to light. No scleral icterus.  Neck: Normal range of motion. Neck supple. No JVD present.  Cardiovascular: Normal rate, regular rhythm and normal heart sounds.  Exam reveals no gallop and no friction rub.   No murmur heard. tachycardic  Pulmonary/Chest: She is in respiratory distress (moderate). She has wheezes.  Severely increased work of breathing. 94% on 8 L. Supraclavicular, intercostal  retractions with belly breathing. Diffuse inspiratory and expiratory wheezes with prolonged expiratory phase.   Abdominal: Soft. Bowel sounds are normal. She exhibits no distension and no mass. There is no tenderness. There is  no guarding.  Neurological: She is alert and oriented to person, place, and time.  Skin: Skin is warm and dry. She is not diaphoretic.    ED Course  Procedures (including critical care time) Labs Review Labs Reviewed  CBC WITH DIFFERENTIAL  BASIC METABOLIC PANEL    Imaging Review Dg Chest 2 View  05/14/2014   CLINICAL DATA:  Wheezing  EXAM: CHEST  2 VIEW  COMPARISON:  None.  FINDINGS: Normal heart size and mediastinal contours. No acute infiltrate or edema. No effusion or pneumothorax. No acute osseous findings.   IMPRESSION: No evidence of pneumonia or air leak.   Electronically Signed   By: Tiburcio PeaJonathan  Watts M.D.   On: 05/14/2014 13:14     EKG Interpretation None      MDM   Final diagnoses:  None    Patient with repsiratory distress. Improved after 2 hour long neb treatments. She however desaturates with ambulation down to 90% on RA She is 94 % on ra t rest. Continues to have prolnged ex phase with diffuse coarse wheezes. She s receiving a 3rd treatment at this time. I have given report to PA [place] who will assume care of the pateint.    Arthor CaptainAbigail Harris, PA-C 05/14/14 2232

## 2014-05-14 NOTE — H&P (Signed)
[facility] History and Physical  Ana CapriCrystal Patel ZOX:[id number] DOB: 01/17/1996 DOA: 05/14/2014  Referring physician: ED PCP: No PCP Per Patient   Chief Complaint:  Shortness of breath with wheezing for past 10 days  HPI:  18 year old female with history of asthma since childhood with more than 6-8 exacerbations in a year, frequently on prednisone and never intubated who presented to the ED with shortness of breath and wheezing for past one and half weeks. Patient reports that she felt short of breath at work 10 days back when she forgot to bring her inhaler and since then she has been feeling wheezy and short of breath. She has been using nebulizer at home quite frequently. Since this morning she has been persistently wheezing and having chest tightness. Reports having dry cough. She denies any fever, chills, nausea, vomiting, headache, dizziness, chest pain, palpitations, abdominal pain, bowel or urinary symptoms. Denies change in weight or appetite. She denies any recent travel or sick contacts. Denies history of smoking or exposure to smoke or fumes. Patient reports recently moving from [place] to this area and says that she has been assigned a PCP in the community but does not remember the name.  Course in the ED The patient was afebrile. She was tachycardic to 130s. Blood pressure as noted it was stable. She was saturating normal on room . Blood work done showed normal CBC. Chemistry showed a potassium of 3.3. Chest x-ray was unremarkable. Patient given several rounds of albuterol neb and IV magnesium sulfate followed by IV morphine and Zofran without much improvement in her wheezing and hospitalists called for admission and observation to medical floor.  Review of Systems:  Constitutional: Denies fever, chills, diaphoresis, appetite change and fatigue.  HEENT: Denies  eye pain,, hearing loss, ear pain, congestion, sore throat, rhinorrhea, sneezing, mouth sores, trouble  swallowing, neck pain,  Respiratory:  SOB, DOE, cough, chest tightness,  and wheezing.   Cardiovascular: Denies chest pain, palpitations and leg swelling.  Gastrointestinal: Denies nausea, vomiting, abdominal pain, diarrhea, constipation, blood in stool and abdominal distention.  Genitourinary: Denies dysuria, urgency, frequency, hematuria, flank pain and difficulty urinating.  Endocrine: Denies hot or cold intolerance,  Musculoskeletal: Denies myalgias, back pain, joint swelling, arthralgias and gait problem.  Neurological: Denies dizziness, seizures, syncope, weakness, light-headedness, numbness and headaches.     Past Medical History  Diagnosis Date  . Asthma   . Seizures    Past Surgical History  Procedure Laterality Date  . Nasal polyp surgery     Social History:  reports that she has been smoking.  She does not have any smokeless tobacco history on file. She reports that she drinks alcohol. She reports that she uses illicit drugs (Marijuana).  No Known Allergies  No family history on file.  Prior to Admission medications   Medication Sig Start Date End Date Taking? Authorizing Provider  albuterol (PROVENTIL HFA;VENTOLIN HFA) 108 (90 BASE) MCG/ACT inhaler Inhale 2 puffs into the lungs every 2 (two) hours as needed for wheezing or shortness of breath.   Yes Historical Provider, MD  guaiFENesin (MUCINEX) 600 MG 12 hr tablet Take 600 mg by mouth 2 (two) times daily as needed for cough or to loosen phlegm.   Yes Historical Provider, MD  levETIRAcetam (KEPPRA) 500 MG tablet Take 500 mg by mouth 2 (two) times daily.   Yes Historical Provider, MD  naproxen sodium (ANAPROX) 220 MG tablet Take 440 mg by mouth 2 (two) times daily as needed (pain).   Yes Historical  Provider, MD     Physical Exam:  Filed Vitals:   05/14/14 1454 05/14/14 1645 05/14/14 1653 05/14/14 1700  BP:   106/59   Pulse: 118 129 127 131  Temp:      TempSrc:      Resp:   18   SpO2: 94% 94% 98% 95%     Constitutional: Vital signs reviewed.  Young female in no acute distress. Able to speak in full sentences. HEENT: no pallor, no icterus, moist oral mucosa, no cervical lymphadenopathy Cardiovascular: S1 and S2 tachycardic,, no MRG Chest: Bilateral diffuse wheeze, no rhonchi or crackles Abdominal: Soft. Non-tender, non-distended, bowel sounds are normal, no masses, organomegaly, or guarding present.  Ext: warm, no edema Neurological: A&O x3, non focal  Labs on Admission:  Basic Metabolic Panel:  Recent Labs Lab 05/14/14 1656  NA 138  K 3.3*  CL 99  CO2 20  GLUCOSE 135*  BUN 8  CREATININE 0.76  CALCIUM 9.1   Liver Function Tests: No results found for this basename: AST, ALT, ALKPHOS, BILITOT, PROT, ALBUMIN,  in the last 168 hours No results found for this basename: LIPASE, AMYLASE,  in the last 168 hours No results found for this basename: AMMONIA,  in the last 168 hours CBC:  Recent Labs Lab 05/14/14 1656  WBC 7.9  NEUTROABS 7.2  HGB 13.4  HCT 40.4  MCV 79.5  PLT 338   Cardiac Enzymes: No results found for this basename: CKTOTAL, CKMB, CKMBINDEX, TROPONINI,  in the last 168 hours BNP: No components found with this basename: POCBNP,  CBG: No results found for this basename: GLUCAP,  in the last 168 hours  Radiological Exams on Admission: Dg Chest 2 View  05/14/2014   CLINICAL DATA:  Wheezing  EXAM: CHEST  2 VIEW  COMPARISON:  None.  FINDINGS: Normal heart size and mediastinal contours. No acute infiltrate or edema. No effusion or pneumothorax. No acute osseous findings.  IMPRESSION: No evidence of pneumonia or air leak.   Electronically Signed   By: Tiburcio PeaJonathan  Watts M.D.   On: 05/14/2014 13:14      Assessment/Plan  Principal Problem:   Acute Asthma exacerbation Admit under observation to med surg I will start her on IV Solu-Medrol 80 mg in 8 hours. -Place her on scheduled Xopenex nebs every 4 hours and albuterol nebs every 2 hours as needed for wheezing.  Add Atrovent nebs. Continue albuterol inhaler as needed. Supportive care with Tylenol, antitussives and antiemetics. -Monitor O2 sat -replenish low k and mg  Active Problems:   Hypokalemia Replenish  Seizure dx continue keppra     Diet: Regular DVT prophylaxis: sq lovenox   Code Status: full code Family Communication: Friend at at bedside Disposition Plan: Home once improved  Achillies Buehl [facility] Pager [phone]  Total time spent on admission :50 minutes  If 7PM-7AM, please contact night-coverage [email or web address] Password [email or web address] 05/14/2014, 6:01 PM

## 2014-05-14 NOTE — ED Notes (Signed)
Pt resting comfortably.  O2 sats reassessed to be 92 - 94% on RA.

## 2014-05-14 NOTE — ED Notes (Signed)
Patient transported to X-ray 

## 2014-05-14 NOTE — Progress Notes (Signed)
Called for report. ED nurse unavailable and will call floor RN back.

## 2014-05-14 NOTE — ED Notes (Signed)
Per EMS - pt comes from home where she lives with family.  Pt has had ongoing wheezing x2 weeks.  Pt uses albuterol nebs at home and has been using them with no relief.  Pt received 10 mg albuterol and 0.5 mg Atrovent and 125 mg Solumedrol IVP en route and still have audible wheezing.  Initial sats were reported as 90% on RA.

## 2014-05-14 NOTE — ED Notes (Signed)
Pt's sats were 91 - 92% on RA while ambulating.

## 2014-05-15 MED ORDER — IBUPROFEN 600 MG PO TABS
600.0000 mg | ORAL_TABLET | Freq: Once | ORAL | Status: AC
  Administered 2014-05-15: 600 mg via ORAL
  Filled 2014-05-15: qty 1

## 2014-05-15 MED ORDER — GUAIFENESIN-DM 100-10 MG/5ML PO SYRP
5.0000 mL | ORAL_SOLUTION | ORAL | Status: DC | PRN
  Administered 2014-05-15 – 2014-05-16 (×3): 5 mL via ORAL
  Filled 2014-05-15 (×3): qty 10

## 2014-05-15 MED ORDER — MORPHINE SULFATE 2 MG/ML IJ SOLN
2.0000 mg | Freq: Once | INTRAMUSCULAR | Status: AC
  Administered 2014-05-15: 2 mg via INTRAVENOUS
  Filled 2014-05-15: qty 1

## 2014-05-15 NOTE — Progress Notes (Signed)
[facility] PROGRESS NOTE  Hamilton CapriCrystal Monsivais XBJ:[id number] DOB: Feb 16, 1996 DOA: 05/14/2014 PCP: No PCP Per Patient  Assessment/Plan: Acute Asthma exacerbation  - IV Solu-Medrol 80 mg in 8 hours.  -continue  scheduled Xopenex nebs every 4 hours and albuterol nebs every 2 hours as needed for wheezing. continue  Atrovent nebs. Continue albuterol inhaler as needed. Supportive care with Tylenol, antitussives and antiemetics.  -Monitor O2 sat  -replenish low k and mg   Active Problems:  Hypokalemia  Replenished  Seizure dx  continue keppra   Diet: Regular   DVT prophylaxis: sq lovenox   Code Status: full Family Communication: friend at bedside Disposition Plan: home likely in next 24 hrs if stable   Consultants:  none  Procedures:  none  Antibiotics:  none  HPI/Subjective: Still wheezy but feeling better  Objective: Filed Vitals:   05/15/14 0645  BP: 108/64  Pulse: 105  Temp: 97.5 F (36.4 C)  Resp: 20    Intake/Output Summary (Last 24 hours) at 05/15/14 0815 Last data filed at 05/15/14 0646  Gross per 24 hour  Intake    360 ml  Output    600 ml  Net   -240 ml   Filed Weights   05/14/14 2005  Weight: 54.885 kg (121 lb)    Exam:   General:  NAD  Cardiovascular: NS1 &s2   Respiratory: b/L wheeze, no crackles or rhonchi  Abdomen: soft , NT, ND, BS+    Data Reviewed: Basic Metabolic Panel:  Recent Labs Lab 05/14/14 1656  NA 138  K 3.3*  CL 99  CO2 20  GLUCOSE 135*  BUN 8  CREATININE 0.76  CALCIUM 9.1   Liver Function Tests: No results found for this basename: AST, ALT, ALKPHOS, BILITOT, PROT, ALBUMIN,  in the last 168 hours No results found for this basename: LIPASE, AMYLASE,  in the last 168 hours No results found for this basename: AMMONIA,  in the last 168 hours CBC:  Recent Labs Lab 05/14/14 1656  WBC 7.9  NEUTROABS 7.2  HGB 13.4  HCT 40.4  MCV 79.5  PLT 338   Cardiac Enzymes: No results found for this  basename: CKTOTAL, CKMB, CKMBINDEX, TROPONINI,  in the last 168 hours BNP (last 3 results) No results found for this basename: PROBNP,  in the last 8760 hours CBG: No results found for this basename: GLUCAP,  in the last 168 hours  No results found for this or any previous visit (from the past 240 hour(s)).   Studies: Dg Chest 2 View  05/14/2014   CLINICAL DATA:  Wheezing  EXAM: CHEST  2 VIEW  COMPARISON:  None.  FINDINGS: Normal heart size and mediastinal contours. No acute infiltrate or edema. No effusion or pneumothorax. No acute osseous findings.  IMPRESSION: No evidence of pneumonia or air leak.   Electronically Signed   By: Tiburcio PeaJonathan  Watts M.D.   On: 05/14/2014 13:14    Scheduled Meds: . enoxaparin (LOVENOX) injection  40 mg Subcutaneous Q24H  . guaiFENesin  600 mg Oral BID  . ipratropium  0.5 mg Nebulization Q4H  . levalbuterol  0.63 mg Nebulization Q4H  . levETIRAcetam  500 mg Oral BID  . methylPREDNISolone (SOLU-MEDROL) injection  80 mg Intravenous 3 times per day  . pneumococcal 23 valent vaccine  0.5 mL Intramuscular Tomorrow-1000   Continuous Infusions:    Time spent: 25 minutes    Evalynne Locurto  [facility] Pager [phone]. If 7PM-7AM, please contact night-coverage at [email or web address], password [email or web address] 05/15/2014,  8:15 AM  LOS: 1 day

## 2014-05-15 NOTE — ED Provider Notes (Signed)
Medical screening examination/treatment/procedure(s) were conducted as a shared visit with non-physician practitioner(s) and myself.  I personally evaluated the patient during the encounter.   Please see my separate note.     Candyce ChurnJohn David Kyair Ditommaso III, MD 05/15/14 1420

## 2014-05-15 NOTE — Progress Notes (Signed)
Pt called back and requested her neb. Pt is wheezing vitals stable.

## 2014-05-15 NOTE — Progress Notes (Signed)
Pt refused her 12:00 neb tx. Pt in no distress at this time.

## 2014-05-15 NOTE — ED Provider Notes (Signed)
Medical screening examination/treatment/procedure(s) were conducted as a shared visit with non-physician practitioner(s) and myself.  I personally evaluated the patient during the encounter.   Please see my separate note.     Candyce ChurnJohn David Wofford III, MD 05/15/14 [id number]

## 2014-05-16 DIAGNOSIS — J4531 Mild persistent asthma with (acute) exacerbation: Secondary | ICD-10-CM | POA: Diagnosis present

## 2014-05-16 DIAGNOSIS — G40909 Epilepsy, unspecified, not intractable, without status epilepticus: Secondary | ICD-10-CM | POA: Diagnosis present

## 2014-05-16 MED ORDER — ALBUTEROL SULFATE HFA 108 (90 BASE) MCG/ACT IN AERS: 2.0000 | INHALATION_SPRAY | Freq: Four times a day (QID) | RESPIRATORY_TRACT | Status: DC | PRN

## 2014-05-16 MED ORDER — PREDNISONE 20 MG PO TABS
20.0000 mg | ORAL_TABLET | Freq: Every day | ORAL | Status: AC
Start: 2014-05-16 — End: ?

## 2014-05-16 MED ORDER — ALBUTEROL SULFATE (2.5 MG/3ML) 0.083% IN NEBU: 2.5000 mg | INHALATION_SOLUTION | Freq: Four times a day (QID) | RESPIRATORY_TRACT | Status: DC | PRN

## 2014-05-16 MED ORDER — FLUTICASONE-SALMETEROL 250-50 MCG/DOSE IN AEPB: 1.0000 | INHALATION_SPRAY | Freq: Two times a day (BID) | RESPIRATORY_TRACT | Status: DC

## 2014-05-16 MED ORDER — GUAIFENESIN-DM 100-10 MG/5ML PO SYRP: 5.0000 mL | ORAL_SOLUTION | ORAL | Status: DC | PRN

## 2014-05-16 NOTE — Discharge Summary (Addendum)
Physician Discharge Summary  Ana Clarke WUJ:[id number] DOB: 1996/02/07 DOA: 05/14/2014  PCP: patient does not remember the name of her PCP Admit date: 05/14/2014 Discharge date: 05/16/2014  Time spent: 30 minutes  Recommendations for Outpatient Follow-up:  D/c home with outpt PCP folow up  Discharge Diagnoses:  Principal Problem:   Mild persistent asthma with acute exacerbation in adult  Active Problems:   Hypokalemia   Seizure disorder   Discharge Condition: FAIR  Diet recommendation: regular  Filed Weights   05/14/14 2005  Weight: 54.885 kg (121 lb)    History of present illness:  18 year old female with history of asthma since childhood with more than 6-8 exacerbations in a year, frequently on prednisone and never intubated who presented to the ED with shortness of breath and wheezing for past one and half weeks. Patient reports that she felt short of breath at work 10 days back when she forgot to bring her inhaler and since then she has been feeling wheezy and short of breath. She has been using nebulizer at home quite frequently. Since this morning she has been persistently wheezing and having chest tightness. Reports having dry cough. She denies any fever, chills, nausea, vomiting, headache, dizziness, chest pain, palpitations, abdominal pain, bowel or urinary symptoms. Denies change in weight or appetite. She denies any recent travel or sick contacts. Denies history of smoking or exposure to smoke or fumes.  Patient reports recently moving from [place] to this area and says that she has been assigned a PCP in the community but does not remember the name.  Course in the ED  The patient was afebrile. She was tachycardic to 130s. Blood pressure as noted it was stable. She was saturating normal on room .  Blood work done showed normal CBC. Chemistry showed a potassium of 3.3. Chest x-ray was unremarkable.  Patient given several rounds of albuterol neb and IV magnesium  sulfate followed by IV morphine and Zofran without much improvement in her wheezing and hospitalist called for admission  to medical floor.   Hospital Course:  Acute Asthma exacerbation  - placed on IV Solu-Medrol 80 mg in 8 hours.  -continued on  scheduled Xopenex nebs every 4 hours and albuterol nebs every 2 hours as needed for wheezing. Added  Atrovent nebs.  albuterol inhaler as needed. Supportive care provided with Tylenol, antitussives and antiemetics.  -stable o2 sat. patient clinically improved and no further wheezing. She is stable for discharge with a 12 day oral prednisone taper. Will prescribe albuterol inhaler for rescue symptoms, albuterol nebs for prn use and bid advair inhaler. -she is advised to follow up with her PCP in 1 week. Given instructions on asthma attack prevention. -she would benefit from outpt Pulmonary referral given long hx of asthma and mild persistent symptoms.   Active Problems:  Hypokalemia  Replenished   Seizure dx  continue keppra   Diet: Regular   Code Status: full  Family Communication: friend at bedside  Disposition Plan: home   Consultants:  None   Procedures:  None   Antibiotics:  none  Discharge Exam: Filed Vitals:   05/16/14 0555  BP: 114/71  Pulse: 83  Temp: 97.5 F (36.4 C)  Resp: 16   General: NAD  Cardiovascular: NS1 &s2  Respiratory: clear b/l, no added sounds Abdomen: soft , NT, ND, BS+ Ext: warm: no edema    Discharge Instructions You were cared for by a hospitalist during your hospital stay. If you have any questions about your discharge medications  or the care you received while you were in the hospital after you are discharged, you can call the unit and asked to speak with the hospitalist on call if the hospitalist that took care of you is not available. Once you are discharged, your primary care physician will handle any further medical issues. Please note that NO REFILLS for any discharge medications will be  authorized once you are discharged, as it is imperative that you return to your primary care physician (or establish a relationship with a primary care physician if you do not have one) for your aftercare needs so that they can reassess your need for medications and monitor your lab values.     Medication List    STOP taking these medications       guaiFENesin 600 MG 12 hr tablet  Commonly known as:  MUCINEX      TAKE these medications       albuterol 108 (90 BASE) MCG/ACT inhaler  Commonly known as:  PROVENTIL HFA;VENTOLIN HFA  Inhale 2 puffs into the lungs every 6 (six) hours as needed for wheezing or shortness of breath.     albuterol (2.5 MG/3ML) 0.083% nebulizer solution  Commonly known as:  PROVENTIL  Take 3 mLs (2.5 mg total) by nebulization every 6 (six) hours as needed for wheezing or shortness of breath.     Fluticasone-Salmeterol 250-50 MCG/DOSE Aepb  Commonly known as:  ADVAIR DISKUS  Inhale 1 puff into the lungs 2 (two) times daily.     guaiFENesin-dextromethorphan 100-10 MG/5ML syrup  Commonly known as:  ROBITUSSIN DM  Take 5 mLs by mouth every 4 (four) hours as needed for cough.     levETIRAcetam 500 MG tablet  Commonly known as:  KEPPRA  Take 500 mg by mouth 2 (two) times daily.     naproxen sodium 220 MG tablet  Commonly known as:  ANAPROX  Take 440 mg by mouth 2 (two) times daily as needed (pain).     predniSONE 20 MG tablet  Commonly known as:  DELTASONE  Take 1 tablet (20 mg total) by mouth daily with breakfast.    Take 40 mg daily for 3 days, then 30 mg daily for next 3 days, then 20 mg daily for next 3 days then 10 mg daily for next 3 days then stop   No Known Allergies     Follow-up Information   Follow up with No PCP Per Patient. (in 1 week)    Specialty:  General Practice       The results of significant diagnostics from this hospitalization (including imaging, microbiology, ancillary and laboratory) are listed below for reference.     Significant Diagnostic Studies: Dg Chest 2 View  05/14/2014   CLINICAL DATA:  Wheezing  EXAM: CHEST  2 VIEW  COMPARISON:  None.  FINDINGS: Normal heart size and mediastinal contours. No acute infiltrate or edema. No effusion or pneumothorax. No acute osseous findings.  IMPRESSION: No evidence of pneumonia or air leak.   Electronically Signed   By: Tiburcio PeaJonathan  Watts M.D.   On: 05/14/2014 13:14    Microbiology: No results found for this or any previous visit (from the past 240 hour(s)).   Labs: Basic Metabolic Panel:  Recent Labs Lab 05/14/14 1656  NA 138  K 3.3*  CL 99  CO2 20  GLUCOSE 135*  BUN 8  CREATININE 0.76  CALCIUM 9.1   Liver Function Tests: No results found for this basename: AST, ALT, ALKPHOS, BILITOT, PROT,  ALBUMIN,  in the last 168 hours No results found for this basename: LIPASE, AMYLASE,  in the last 168 hours No results found for this basename: AMMONIA,  in the last 168 hours CBC:  Recent Labs Lab 05/14/14 1656  WBC 7.9  NEUTROABS 7.2  HGB 13.4  HCT 40.4  MCV 79.5  PLT 338   Cardiac Enzymes: No results found for this basename: CKTOTAL, CKMB, CKMBINDEX, TROPONINI,  in the last 168 hours BNP: BNP (last 3 results) No results found for this basename: PROBNP,  in the last 8760 hours CBG: No results found for this basename: GLUCAP,  in the last 168 hours     Signed:  Eddie NorthHUNGEL, NISHANT  [facility] 05/16/2014, 8:23 AM

## 2014-05-16 NOTE — Progress Notes (Signed)
Pt discharged home with significant other in stable condition. Discharge instructions and scripts given. Pt verbalized understanding. 

## 2014-05-16 NOTE — Care Management Note (Signed)
    Page 1 of 1   05/16/2014     11:50:21 AM CARE MANAGEMENT NOTE 05/16/2014  Patient:  Ana Clarke,Ana Clarke   Account Number:  [id number]  Date Initiated:  05/16/2014  Documentation initiated by:  Lanier ClamMAHABIR,Ameena Vesey  Subjective/Objective Assessment:   18 Y/O F ADMITTED W/ASTHMA EXAC.     Action/Plan:   FROM HOME.   Anticipated DC Date:  05/16/2014   Anticipated DC Plan:  HOME/SELF CARE      DC Planning Services  CM consult      Choice offered to / List presented to:             Status of service:  Completed, signed off Medicare Important Message given?   (If response is "NO", the following Medicare IM given date fields will be blank) Date Medicare IM given:   Date Additional Medicare IM given:    Discharge Disposition:  HOME/SELF CARE  Per UR Regulation:  Reviewed for med. necessity/level of care/duration of stay  If discussed at Long Length of Stay Meetings, dates discussed:    Comments:  05/16/14 Avik Leoni RN,BSN NCM [phone] D/C HOME NO NEEDS OR ORDERS.

## 2014-05-16 NOTE — Discharge Instructions (Signed)

## 2014-05-23 ENCOUNTER — Emergency Department (HOSPITAL_COMMUNITY)

## 2014-05-23 ENCOUNTER — Emergency Department (HOSPITAL_COMMUNITY)
Admission: EM | Admit: 2014-05-23 | Discharge: 2014-05-23 | Disposition: A | Discharge status: Home / Self Care | Attending: Emergency Medicine | Admitting: Emergency Medicine

## 2014-05-23 ENCOUNTER — Encounter (HOSPITAL_COMMUNITY): Payer: Self-pay | Admitting: Emergency Medicine

## 2014-05-23 DIAGNOSIS — IMO0002 Reserved for concepts with insufficient information to code with codable children: Secondary | ICD-10-CM | POA: Insufficient documentation

## 2014-05-23 DIAGNOSIS — Z79899 Other long term (current) drug therapy: Secondary | ICD-10-CM | POA: Insufficient documentation

## 2014-05-23 DIAGNOSIS — F17200 Nicotine dependence, unspecified, uncomplicated: Secondary | ICD-10-CM | POA: Insufficient documentation

## 2014-05-23 DIAGNOSIS — Z791 Long term (current) use of non-steroidal anti-inflammatories (NSAID): Secondary | ICD-10-CM | POA: Insufficient documentation

## 2014-05-23 DIAGNOSIS — J45901 Unspecified asthma with (acute) exacerbation: Secondary | ICD-10-CM | POA: Insufficient documentation

## 2014-05-23 DIAGNOSIS — G40909 Epilepsy, unspecified, not intractable, without status epilepticus: Secondary | ICD-10-CM | POA: Insufficient documentation

## 2014-05-23 DIAGNOSIS — J4531 Mild persistent asthma with (acute) exacerbation: Secondary | ICD-10-CM

## 2014-05-23 MED ORDER — IPRATROPIUM BROMIDE 0.02 % IN SOLN
0.5000 mg | Freq: Once | RESPIRATORY_TRACT | Status: AC
  Administered 2014-05-23: 0.5 mg via RESPIRATORY_TRACT
  Filled 2014-05-23: qty 2.5

## 2014-05-23 MED ORDER — ALBUTEROL SULFATE (2.5 MG/3ML) 0.083% IN NEBU
5.0000 mg | INHALATION_SOLUTION | Freq: Once | RESPIRATORY_TRACT | Status: AC
  Administered 2014-05-23: 5 mg via RESPIRATORY_TRACT
  Filled 2014-05-23: qty 6

## 2014-05-23 NOTE — Discharge Instructions (Signed)

## 2014-05-23 NOTE — ED Provider Notes (Signed)
CSN: [id number]     Arrival date & time 05/23/14  1922 History   First MD Initiated Contact with Patient 05/23/14 1938     Chief Complaint  Patient presents with  . Asthma     (Consider location/radiation/quality/duration/timing/severity/associated sxs/prior Treatment) Patient is a 18 y.o. female presenting with shortness of breath. The history is provided by the patient. No language interpreter was used.  Shortness of Breath Severity:  Moderate Onset quality:  Gradual Duration:  1 week Timing:  Intermittent Progression:  Worsening Chronicity:  Recurrent Context: activity   Relieved by:  Inhaler Exacerbated by: heat. Ineffective treatments:  Inhaler Associated symptoms: wheezing   Associated symptoms: no chest pain, no cough, no fever, no rash, no sore throat and no syncope   Risk factors: no hx of PE/DVT     18 year old female with hx of asthma and seizure presents via EMS c/o sob.  Pt report acute onset of SOB while walking today along with wheezing.  Pt was given solumedrol and duonebs PTA.    Past Medical History  Diagnosis Date  . Asthma   . Seizures    Past Surgical History  Procedure Laterality Date  . Nasal polyp surgery     No family history on file. History  Substance Use Topics  . Smoking status: Current Some Day Smoker  . Smokeless tobacco: Not on file  . Alcohol Use: Yes     Comment: occasionally    OB History   Grav Para Term Preterm Abortions TAB SAB Ect Mult Living                 Review of Systems  Constitutional: Negative for fever.  HENT: Negative for sore throat.   Respiratory: Positive for shortness of breath and wheezing. Negative for cough.   Cardiovascular: Negative for chest pain and syncope.  Skin: Negative for rash.  All other systems reviewed and are negative.     Allergies  Review of patient's allergies indicates no known allergies.  Home Medications   Prior to Admission medications   Medication Sig Start Date End Date  Taking? Authorizing Provider  albuterol (PROVENTIL HFA;VENTOLIN HFA) 108 (90 BASE) MCG/ACT inhaler Inhale 2 puffs into the lungs every 6 (six) hours as needed for wheezing or shortness of breath. 05/16/14   Nishant Dhungel, MD  albuterol (PROVENTIL) (2.5 MG/3ML) 0.083% nebulizer solution Take 3 mLs (2.5 mg total) by nebulization every 6 (six) hours as needed for wheezing or shortness of breath. 05/16/14   Nishant Dhungel, MD  Fluticasone-Salmeterol (ADVAIR DISKUS) 250-50 MCG/DOSE AEPB Inhale 1 puff into the lungs 2 (two) times daily. 05/16/14   Nishant Dhungel, MD  guaiFENesin-dextromethorphan (ROBITUSSIN DM) 100-10 MG/5ML syrup Take 5 mLs by mouth every 4 (four) hours as needed for cough. 05/16/14   Nishant Dhungel, MD  levETIRAcetam (KEPPRA) 500 MG tablet Take 500 mg by mouth 2 (two) times daily.    Historical Provider, MD  naproxen sodium (ANAPROX) 220 MG tablet Take 440 mg by mouth 2 (two) times daily as needed (pain).    Historical Provider, MD  predniSONE (DELTASONE) 20 MG tablet Take 1 tablet (20 mg total) by mouth daily with breakfast. 05/16/14   Nishant Dhungel, MD   BP 132/67  Pulse 95  Temp(Src) 98.7 F (37.1 C) (Oral)  Resp 20  SpO2 100%  LMP 04/26/2014 Physical Exam  Nursing note and vitals reviewed. Constitutional: She is oriented to person, place, and time. She appears well-developed and well-nourished. No distress.  HENT:  Head: Atraumatic.  Mouth/Throat: Oropharynx is clear and moist.  Rhinorrhea with nasal polyps bilat.   Eyes: Conjunctivae are normal.  Neck: Normal range of motion. Neck supple.  Cardiovascular: Normal rate and regular rhythm.   Pulmonary/Chest: Effort normal. She has wheezes.  Abdominal: Soft. There is no tenderness.  Musculoskeletal: She exhibits no edema.  Neurological: She is alert and oriented to person, place, and time.  Skin: No rash noted.  Psychiatric: She has a normal mood and affect.    ED Course  Procedures (including critical care  time)  7:58 PM Patient here with worsening shortness of breath related to asthma. She was seen a week ago for same. She was discharged with medication including steroid however able to refill her medication due to lack of funds. She is currently actively wheezing  9:22 PM Wheezing improves after treatment.  Pt able to ambulate while maintaining adequate O2.  Scores low on Well's criteria for PE.  CXR ordered, pt declined and request to be discharge because she feels better.  Pt stable for discharge.  Care discussed with Dr. Romeo AppleHarrison  Labs Review Labs Reviewed - No data to display  Imaging Review No results found.   EKG Interpretation None      MDM   Final diagnoses:  Mild persistent asthma with acute exacerbation in adult    BP 132/67  Pulse 95  Temp(Src) 98.7 F (37.1 C) (Oral)  Resp 20  SpO2 100%  LMP 04/26/2014      Fayrene HelperBowie Tran, PA-C 05/23/14 2127

## 2014-05-23 NOTE — ED Notes (Signed)
Bed: WA21 Expected date:  Expected time:  Means of arrival:  Comments: ems- asthma 

## 2014-05-23 NOTE — ED Notes (Signed)
While ambulating patient O2 was 96. Did not feel dizzy or anything. Felt fine.

## 2014-05-23 NOTE — ED Notes (Signed)
Pt presents with c/o asthma, hx of same. Pt reported to EMS that she started getting short of breath as she was walking around, breathing tx given en route 10 albuterol, and .5 atrovent, 125 solumedrol. Pt has 20 gauge left AC started by EMS.

## 2014-05-24 NOTE — ED Provider Notes (Signed)
Medical screening examination/treatment/procedure(s) were conducted as a shared visit with non-physician practitioner(s) and myself.  I personally evaluated the patient during the encounter.   EKG Interpretation   Date/Time:  Monday May 23 2014 19:31:16 EDT Ventricular Rate:  91 PR Interval:  157 QRS Duration: 77 QT Interval:  346 QTC Calculation: 426 R Axis:   80 Text Interpretation:  Sinus rhythm No significant change since last  tracing Confirmed by HARRISON  MD, FORREST ([id number]) on 05/23/2014 7:43:00 PM      I interviewed and examined the patient. Lungs are CTAB. Cardiac exam wnl. Abdomen soft. Pt s/p breathing tx, no appreciable wheezing heard. Will d/c home and rec pt get Rx for prednisone.   Junius ArgyleForrest S Harrison, MD 05/24/14 1028

## 2014-07-16 ENCOUNTER — Inpatient Hospital Stay (HOSPITAL_COMMUNITY)
Admission: EM | Admit: 2014-07-16 | Discharge: 2014-07-18 | DRG: 208 | Disposition: A | Discharge status: Home / Self Care | Attending: Emergency Medicine | Admitting: Emergency Medicine

## 2014-07-16 ENCOUNTER — Emergency Department (HOSPITAL_COMMUNITY)

## 2014-07-16 ENCOUNTER — Inpatient Hospital Stay (HOSPITAL_COMMUNITY)

## 2014-07-16 ENCOUNTER — Encounter (HOSPITAL_COMMUNITY): Payer: Self-pay | Admitting: Emergency Medicine

## 2014-07-16 DIAGNOSIS — J9600 Acute respiratory failure, unspecified whether with hypoxia or hypercapnia: Secondary | ICD-10-CM | POA: Diagnosis present

## 2014-07-16 DIAGNOSIS — J4552 Severe persistent asthma with status asthmaticus: Secondary | ICD-10-CM

## 2014-07-16 DIAGNOSIS — F17200 Nicotine dependence, unspecified, uncomplicated: Secondary | ICD-10-CM | POA: Diagnosis present

## 2014-07-16 DIAGNOSIS — R0602 Shortness of breath: Secondary | ICD-10-CM | POA: Diagnosis present

## 2014-07-16 DIAGNOSIS — G9340 Encephalopathy, unspecified: Secondary | ICD-10-CM | POA: Diagnosis present

## 2014-07-16 DIAGNOSIS — J209 Acute bronchitis, unspecified: Secondary | ICD-10-CM | POA: Diagnosis present

## 2014-07-16 DIAGNOSIS — J45902 Unspecified asthma with status asthmaticus: Principal | ICD-10-CM | POA: Diagnosis present

## 2014-07-16 DIAGNOSIS — B954 Other streptococcus as the cause of diseases classified elsewhere: Secondary | ICD-10-CM | POA: Diagnosis present

## 2014-07-16 DIAGNOSIS — R7309 Other abnormal glucose: Secondary | ICD-10-CM | POA: Diagnosis present

## 2014-07-16 DIAGNOSIS — G40909 Epilepsy, unspecified, not intractable, without status epilepticus: Secondary | ICD-10-CM | POA: Diagnosis present

## 2014-07-16 DIAGNOSIS — F1210 Cannabis abuse, uncomplicated: Secondary | ICD-10-CM | POA: Diagnosis present

## 2014-07-16 DIAGNOSIS — J9601 Acute respiratory failure with hypoxia: Secondary | ICD-10-CM

## 2014-07-16 DIAGNOSIS — I498 Other specified cardiac arrhythmias: Secondary | ICD-10-CM | POA: Diagnosis present

## 2014-07-16 DIAGNOSIS — T380X5A Adverse effect of glucocorticoids and synthetic analogues, initial encounter: Secondary | ICD-10-CM | POA: Diagnosis present

## 2014-07-16 LAB — CBC
HEMATOCRIT: 39.3 % (ref 36.0–46.0)
HEMOGLOBIN: 12.2 g/dL (ref 12.0–15.0)
MCH: 25.4 pg — AB (ref 26.0–34.0)
MCHC: 31 g/dL (ref 30.0–36.0)
MCV: 81.7 fL (ref 78.0–100.0)
Platelets: 259 10*3/uL (ref 150–400)
RBC: 4.81 MIL/uL (ref 3.87–5.11)
RDW: 13.6 % (ref 11.5–15.5)
WBC: 17.2 10*3/uL — ABNORMAL HIGH (ref 4.0–10.5)

## 2014-07-16 LAB — URINE MICROSCOPIC-ADD ON

## 2014-07-16 LAB — BLOOD GAS, ARTERIAL
Acid-base deficit: 4.2 mmol/L — ABNORMAL HIGH (ref 0.0–2.0)
BICARBONATE: 20.1 meq/L (ref 20.0–24.0)
Drawn by: 33100
FIO2: 0.6 %
MECHVT: 600 mL
O2 Saturation: 99.7 %
PCO2 ART: 34.1 mmHg — AB (ref 35.0–45.0)
PEEP: 5 cmH2O
Patient temperature: 97.5
RATE: 14 resp/min
TCO2: 21.2 mmol/L (ref 0–100)
pH, Arterial: 7.385 (ref 7.350–7.450)
pO2, Arterial: 245 mmHg — ABNORMAL HIGH (ref 80.0–100.0)

## 2014-07-16 LAB — I-STAT VENOUS BLOOD GAS, ED
Acid-base deficit: 6 mmol/L — ABNORMAL HIGH (ref 0.0–2.0)
BICARBONATE: 25.7 meq/L — AB (ref 20.0–24.0)
O2 Saturation: 46 %
PH VEN: 7.088 — AB (ref 7.250–7.300)
TCO2: 28 mmol/L (ref 0–100)
pCO2, Ven: 85 mmHg (ref 45.0–50.0)
pO2, Ven: 35 mmHg (ref 30.0–45.0)

## 2014-07-16 LAB — RAPID URINE DRUG SCREEN, HOSP PERFORMED
AMPHETAMINES: NOT DETECTED
BARBITURATES: NOT DETECTED
Benzodiazepines: NOT DETECTED
COCAINE: NOT DETECTED
Opiates: NOT DETECTED
Tetrahydrocannabinol: NOT DETECTED

## 2014-07-16 LAB — URINALYSIS, ROUTINE W REFLEX MICROSCOPIC
Bilirubin Urine: NEGATIVE
GLUCOSE, UA: 250 mg/dL — AB
HGB URINE DIPSTICK: NEGATIVE
KETONES UR: NEGATIVE mg/dL
Leukocytes, UA: NEGATIVE
Nitrite: NEGATIVE
PH: 5 (ref 5.0–8.0)
PROTEIN: 30 mg/dL — AB
Specific Gravity, Urine: 1.011 (ref 1.005–1.030)
Urobilinogen, UA: 0.2 mg/dL (ref 0.0–1.0)

## 2014-07-16 LAB — TROPONIN I
Troponin I: <0.3 ng/mL (ref <0.30)
Troponin I: <0.3 ng/mL (ref <0.30)
Troponin I: <0.3 ng/mL (ref <0.30)

## 2014-07-16 LAB — BASIC METABOLIC PANEL
Anion gap: 16 — ABNORMAL HIGH (ref 5–15)
BUN: 11 mg/dL (ref 6–23)
CO2: 21 meq/L (ref 19–32)
CREATININE: 0.79 mg/dL (ref 0.50–1.10)
Calcium: 8.7 mg/dL (ref 8.4–10.5)
Chloride: 102 mEq/L (ref 96–112)
GFR calc Af Amer: >90 mL/min (ref >90)
GLUCOSE: 187 mg/dL — AB (ref 70–99)
Potassium: 4.6 mEq/L (ref 3.7–5.3)
Sodium: 139 mEq/L (ref 137–147)

## 2014-07-16 LAB — GLUCOSE, CAPILLARY
Glucose-Capillary: 128 mg/dL — ABNORMAL HIGH (ref 70–99)
Glucose-Capillary: 169 mg/dL — ABNORMAL HIGH (ref 70–99)
Glucose-Capillary: 117 mg/dL — ABNORMAL HIGH (ref 70–99)

## 2014-07-16 LAB — POC URINE PREG, ED: Preg Test, Ur: NEGATIVE

## 2014-07-16 LAB — PRO B NATRIURETIC PEPTIDE: PRO B NATRI PEPTIDE: 30 pg/mL (ref 0–125)

## 2014-07-16 LAB — MRSA PCR SCREENING: MRSA by PCR: POSITIVE — AB

## 2014-07-16 LAB — LACTIC ACID, PLASMA: Lactic Acid, Venous: 0.9 mmol/L (ref 0.5–2.2)

## 2014-07-16 MED ORDER — SODIUM CHLORIDE 0.9 % IV SOLN
0.0000 ug/h | INTRAVENOUS | Status: DC
  Administered 2014-07-16: 50 ug/h via INTRAVENOUS
  Administered 2014-07-17: 75 ug/h via INTRAVENOUS
  Filled 2014-07-16 (×2): qty 50

## 2014-07-16 MED ORDER — PROPOFOL 10 MG/ML IV EMUL
5.0000 ug/kg/min | INTRAVENOUS | Status: DC
  Administered 2014-07-16 (×2): 30 ug/kg/min via INTRAVENOUS
  Administered 2014-07-17: 35 ug/kg/min via INTRAVENOUS
  Filled 2014-07-16 (×3): qty 100

## 2014-07-16 MED ORDER — ETOMIDATE 2 MG/ML IV SOLN
INTRAVENOUS | Status: AC | PRN
  Administered 2014-07-16: 20 mg via INTRAVENOUS

## 2014-07-16 MED ORDER — DEXTROSE 5 % IV SOLN
500.0000 mg | Freq: Once | INTRAVENOUS | Status: AC
  Administered 2014-07-16: 500 mg via INTRAVENOUS
  Filled 2014-07-16: qty 500

## 2014-07-16 MED ORDER — LORAZEPAM 2 MG/ML IJ SOLN
1.0000 mg | Freq: Once | INTRAMUSCULAR | Status: AC
  Administered 2014-07-16: 1 mg via INTRAVENOUS

## 2014-07-16 MED ORDER — FAMOTIDINE IN NACL 20-0.9 MG/50ML-% IV SOLN
20.0000 mg | Freq: Two times a day (BID) | INTRAVENOUS | Status: DC
  Administered 2014-07-16 – 2014-07-17 (×3): 20 mg via INTRAVENOUS
  Filled 2014-07-16 (×4): qty 50

## 2014-07-16 MED ORDER — SODIUM CHLORIDE 0.9 % IV BOLUS (SEPSIS)
1000.0000 mL | Freq: Once | INTRAVENOUS | Status: AC
Start: 2014-07-16 — End: 2014-07-16
  Administered 2014-07-16: 1000 mL via INTRAVENOUS

## 2014-07-16 MED ORDER — FENTANYL CITRATE 0.05 MG/ML IJ SOLN
INTRAMUSCULAR | Status: AC
  Filled 2014-07-16: qty 2

## 2014-07-16 MED ORDER — DEXTROSE 5 % IV SOLN
250.0000 mg | INTRAVENOUS | Status: DC
  Administered 2014-07-17: 250 mg via INTRAVENOUS
  Filled 2014-07-16 (×2): qty 250

## 2014-07-16 MED ORDER — FENTANYL CITRATE 0.05 MG/ML IJ SOLN
50.0000 ug | Freq: Once | INTRAMUSCULAR | Status: AC
  Administered 2014-07-16: 50 ug via INTRAVENOUS

## 2014-07-16 MED ORDER — ALBUTEROL SULFATE (2.5 MG/3ML) 0.083% IN NEBU: 2.5000 mg | INHALATION_SOLUTION | RESPIRATORY_TRACT | Status: DC | PRN

## 2014-07-16 MED ORDER — FENTANYL CITRATE 0.05 MG/ML IJ SOLN
INTRAMUSCULAR | Status: AC
  Administered 2014-07-16: 50 ug via INTRAVENOUS
  Filled 2014-07-16: qty 2

## 2014-07-16 MED ORDER — MIDAZOLAM HCL 2 MG/2ML IJ SOLN
INTRAMUSCULAR | Status: AC
  Filled 2014-07-16: qty 4

## 2014-07-16 MED ORDER — IPRATROPIUM-ALBUTEROL 0.5-2.5 (3) MG/3ML IN SOLN
3.0000 mL | RESPIRATORY_TRACT | Status: DC
  Administered 2014-07-16 – 2014-07-18 (×12): 3 mL via RESPIRATORY_TRACT
  Filled 2014-07-16 (×13): qty 3

## 2014-07-16 MED ORDER — DOCUSATE SODIUM 50 MG/5ML PO LIQD
100.0000 mg | Freq: Two times a day (BID) | ORAL | Status: DC | PRN
  Filled 2014-07-16: qty 10

## 2014-07-16 MED ORDER — FENTANYL BOLUS VIA INFUSION
50.0000 ug | INTRAVENOUS | Status: DC | PRN
  Filled 2014-07-16: qty 100

## 2014-07-16 MED ORDER — LORAZEPAM 2 MG/ML IJ SOLN
INTRAMUSCULAR | Status: AC
  Filled 2014-07-16: qty 1

## 2014-07-16 MED ORDER — MAGNESIUM SULFATE 40 MG/ML IJ SOLN
2.0000 g | Freq: Once | INTRAMUSCULAR | Status: AC
  Administered 2014-07-16: 2 g via INTRAVENOUS

## 2014-07-16 MED ORDER — INSULIN ASPART 100 UNIT/ML ~~LOC~~ SOLN
2.0000 [IU] | SUBCUTANEOUS | Status: DC
  Administered 2014-07-16: 2 [IU] via SUBCUTANEOUS
  Administered 2014-07-16: 4 [IU] via SUBCUTANEOUS
  Administered 2014-07-17: 2 [IU] via SUBCUTANEOUS
  Administered 2014-07-17: 4 [IU] via SUBCUTANEOUS
  Administered 2014-07-17: 2 [IU] via SUBCUTANEOUS

## 2014-07-16 MED ORDER — METHYLPREDNISOLONE SODIUM SUCC 125 MG IJ SOLR
60.0000 mg | Freq: Four times a day (QID) | INTRAMUSCULAR | Status: DC
  Administered 2014-07-16 – 2014-07-17 (×4): 60 mg via INTRAVENOUS
  Filled 2014-07-16 (×8): qty 0.96

## 2014-07-16 MED ORDER — SODIUM CHLORIDE 0.9 % IV SOLN
INTRAVENOUS | Status: AC | PRN
  Administered 2014-07-16: 150 mL/h via INTRAVENOUS

## 2014-07-16 MED ORDER — HEPARIN SODIUM (PORCINE) 5000 UNIT/ML IJ SOLN
5000.0000 [IU] | Freq: Three times a day (TID) | INTRAMUSCULAR | Status: DC
  Administered 2014-07-16 – 2014-07-18 (×6): 5000 [IU] via SUBCUTANEOUS
  Filled 2014-07-16 (×9): qty 1

## 2014-07-16 MED ORDER — VECURONIUM BROMIDE 10 MG IV SOLR: 10.0000 mg | Freq: Once | INTRAVENOUS | Status: DC

## 2014-07-16 MED ORDER — CHLORHEXIDINE GLUCONATE 0.12 % MT SOLN
15.0000 mL | Freq: Two times a day (BID) | OROMUCOSAL | Status: DC
  Administered 2014-07-16: 15 mL via OROMUCOSAL
  Filled 2014-07-16 (×2): qty 15

## 2014-07-16 MED ORDER — CETYLPYRIDINIUM CHLORIDE 0.05 % MT LIQD
7.0000 mL | Freq: Four times a day (QID) | OROMUCOSAL | Status: DC
  Administered 2014-07-16 – 2014-07-17 (×3): 7 mL via OROMUCOSAL

## 2014-07-16 MED ORDER — MIDAZOLAM HCL 2 MG/2ML IJ SOLN
4.0000 mg | Freq: Once | INTRAMUSCULAR | Status: AC
  Administered 2014-07-16: 4 mg via INTRAVENOUS

## 2014-07-16 MED ORDER — ALBUTEROL (5 MG/ML) CONTINUOUS INHALATION SOLN: 10.0000 mg/h | INHALATION_SOLUTION | RESPIRATORY_TRACT | Status: DC

## 2014-07-16 MED ORDER — SODIUM CHLORIDE 0.9 % IV SOLN
INTRAVENOUS | Status: DC
  Administered 2014-07-16: 08:00:00 via INTRAVENOUS

## 2014-07-16 MED ORDER — FENTANYL CITRATE 0.05 MG/ML IJ SOLN
100.0000 ug | Freq: Once | INTRAMUSCULAR | Status: AC
  Administered 2014-07-16: 100 ug via INTRAVENOUS

## 2014-07-16 MED ORDER — SUCCINYLCHOLINE CHLORIDE 20 MG/ML IJ SOLN
INTRAMUSCULAR | Status: AC | PRN
  Administered 2014-07-16: 120 mg via INTRAVENOUS

## 2014-07-16 MED ORDER — PROPOFOL 10 MG/ML IV EMUL: 5.0000 ug/kg/min | Freq: Once | INTRAVENOUS | Status: DC

## 2014-07-16 MED ORDER — CHLORHEXIDINE GLUCONATE CLOTH 2 % EX PADS: 6.0000 | MEDICATED_PAD | Freq: Every day | CUTANEOUS | Status: DC

## 2014-07-16 MED ORDER — ALBUTEROL (5 MG/ML) CONTINUOUS INHALATION SOLN
INHALATION_SOLUTION | RESPIRATORY_TRACT | Status: AC
  Administered 2014-07-16: 07:00:00
  Filled 2014-07-16: qty 20

## 2014-07-16 MED ORDER — PROPOFOL 10 MG/ML IV EMUL
5.0000 ug/kg/min | INTRAVENOUS | Status: DC
  Administered 2014-07-16: 5 ug/kg/min via INTRAVENOUS

## 2014-07-16 MED ORDER — MUPIROCIN 2 % EX OINT
1.0000 "application " | TOPICAL_OINTMENT | Freq: Two times a day (BID) | CUTANEOUS | Status: DC
  Administered 2014-07-17 – 2014-07-18 (×4): 1 via NASAL
  Filled 2014-07-16: qty 22

## 2014-07-16 NOTE — ED Notes (Signed)
Pt continues to buck the vent. VO received for versed.

## 2014-07-16 NOTE — ED Provider Notes (Signed)
CSN: [id number]     Arrival date & time 07/16/14  4098 History   First MD Initiated Contact with Patient 07/16/14 [phone]     Chief Complaint  Patient presents with  . Asthma     (Consider location/radiation/quality/duration/timing/severity/associated sxs/prior Treatment) HPI  This patient is a 18 yo woman with a history of asthma who comes in via EMS with SOB. Patient in extremis on arrival. Unable to obtain history from the patient due to respiratory distress.   Patient received solumedrol en route. EMS attempted to treat with duoneb but, the patient was combative, agitated and resisted this.   Past Medical History  Diagnosis Date  . Asthma    History reviewed. No pertinent past surgical history. History reviewed. No pertinent family history. History  Substance Use Topics  . Smoking status: Current Every Day Smoker  . Smokeless tobacco: Not on file  . Alcohol Use: No   OB History   Grav Para Term Preterm Abortions TAB SAB Ect Mult Living                 Review of Systems  Unable to obtain due to respiratory distress - level 5 caveat.   Allergies  Review of patient's allergies indicates not on file.  Home Medications   Prior to Admission medications   Not on File   BP 120/88  Pulse 144  Temp(Src) 97.6 F (36.4 C) (Axillary)  Resp 16  SpO2 100% Physical Exam  Gen: well nourished and well developed appearing, the patient appears to be in distress. Head: NCAT Ears: normal to inspection Nose: normal to inspection, no epistaxis or drainage Mouth: oral mucsoa is well hydrated appearing, normal posterior oropharynx Neck: supple, no stridor CV: Rapid and regular with pulse in the 140s, no murmur, palpable peripheral pulses Resp: Labored respirations with prolonged expiratory phase, using all accessory muscles with retractions,. Diminished breath sounds on both sides with scattered high pitched wheezing Abd: soft, nontender, nondistended Extremities: normal to  inspection.  Skin: Cool and dry Neuro: CN ii - XII, no focal deficitis Psyche; anxious affect  ED Course  Procedures (including critical care time) Labs Review Results for orders placed during the hospital encounter of 07/16/14 (from the past 24 hour(s))  CBC     Status: Abnormal   Collection Time    07/16/14  7:02 AM      Result Value Ref Range   WBC 17.2 (*) 4.0 - 10.5 K/uL   RBC 4.81  3.87 - 5.11 MIL/uL   Hemoglobin 12.2  12.0 - 15.0 g/dL   HCT 47.8  29.5 - 62.1 %   MCV 81.7  78.0 - 100.0 fL   MCH 25.4 (*) 26.0 - 34.0 pg   MCHC 31.0  30.0 - 36.0 g/dL   RDW 30.8  65.7 - 84.6 %   Platelets 259  150 - 400 K/uL  BASIC METABOLIC PANEL     Status: Abnormal   Collection Time    07/16/14  7:02 AM      Result Value Ref Range   Sodium 139  137 - 147 mEq/L   Potassium 4.6  3.7 - 5.3 mEq/L   Chloride 102  96 - 112 mEq/L   CO2 21  19 - 32 mEq/L   Glucose, Bld 187 (*) 70 - 99 mg/dL   BUN 11  6 - 23 mg/dL   Creatinine, Ser 9.62  0.50 - 1.10 mg/dL   Calcium 8.7  8.4 - 95.2 mg/dL   GFR calc  non Af Amer >90  >90 mL/min   GFR calc Af Amer >90  >90 mL/min   Anion gap 16 (*) 5 - 15  URINALYSIS, ROUTINE W REFLEX MICROSCOPIC     Status: Abnormal   Collection Time    07/16/14  7:35 AM      Result Value Ref Range   Color, Urine YELLOW  YELLOW   APPearance CLEAR  CLEAR   Specific Gravity, Urine 1.011  1.005 - 1.030   pH 5.0  5.0 - 8.0   Glucose, UA 250 (*) NEGATIVE mg/dL   Hgb urine dipstick NEGATIVE  NEGATIVE   Bilirubin Urine NEGATIVE  NEGATIVE   Ketones, ur NEGATIVE  NEGATIVE mg/dL   Protein, ur 30 (*) NEGATIVE mg/dL   Urobilinogen, UA 0.2  0.0 - 1.0 mg/dL   Nitrite NEGATIVE  NEGATIVE   Leukocytes, UA NEGATIVE  NEGATIVE  URINE RAPID DRUG SCREEN (HOSP PERFORMED)     Status: None   Collection Time    07/16/14  7:35 AM      Result Value Ref Range   Opiates NONE DETECTED  NONE DETECTED   Cocaine NONE DETECTED  NONE DETECTED   Benzodiazepines NONE DETECTED  NONE DETECTED    Amphetamines NONE DETECTED  NONE DETECTED   Tetrahydrocannabinol NONE DETECTED  NONE DETECTED   Barbiturates NONE DETECTED  NONE DETECTED  URINE MICROSCOPIC-ADD ON     Status: None   Collection Time    07/16/14  7:35 AM      Result Value Ref Range   Squamous Epithelial / LPF RARE  RARE   WBC, UA 0-2  <3 WBC/hpf   Urine-Other AMORPHOUS URATES/PHOSPHATES    I-STAT VENOUS BLOOD GAS, ED     Status: Abnormal   Collection Time    07/16/14  7:39 AM      Result Value Ref Range   pH, Ven 7.088 (*) 7.250 - 7.300   pCO2, Ven 85.0 (*) 45.0 - 50.0 mmHg   pO2, Ven 35.0  30.0 - 45.0 mmHg   Bicarbonate 25.7 (*) 20.0 - 24.0 mEq/L   TCO2 28  0 - 100 mmol/L   O2 Saturation 46.0     Acid-base deficit 6.0 (*) 0.0 - 2.0 mmol/L   Patient temperature 36.8 C     Collection site RADIAL, ALLEN'S TEST ACCEPTABLE     Drawn by RT     Sample type VENOUS     Comment NOTIFIED PHYSICIAN    POC URINE PREG, ED     Status: None   Collection Time    07/16/14  7:42 AM      Result Value Ref Range   Preg Test, Ur NEGATIVE  NEGATIVE   INTUBATION Performed by: Brandt Loosen  Required items: required blood products, implants, devices, and special equipment available Patient identity confirmed: provided demographic data and hospital-assigned identification number Time out: Immediately prior to procedure a "time out" was called to verify the correct patient, procedure, equipment, support staff and site/side marked as required.  Indications: respiratory failure  Intubation method: direct laryngoscopy  Preoxygenation: 100% via BVM  Sedatives: 20 mg Etomidate Paralytic: 120 mgSuccinylcholine  Tube Size: 7.5 cuffed  Post-procedure assessment: chest rise and ETCO2 monitor Breath sounds: equal and absent over the epigastrium Tube secured with: ETT holder Chest x-ray interpreted by radiologist and me.  Chest x-ray findings: endotracheal tube in appropriate position  Patient tolerated the procedure well with no  immediate complications.  CRITICAL CARE Performed by: Brandt Loosen   Total  critical care time: 6841m  Critical care time was exclusive of separately billable procedures and treating other patients.  Critical care was necessary to treat or prevent imminent or life-threatening deterioration.  Critical care was time spent personally by me on the following activities: development of treatment plan with patient and/or surrogate as well as nursing, discussions with consultants, evaluation of patient's response to treatment, examination of patient, obtaining history from patient or surrogate, ordering and performing treatments and interventions, ordering and review of laboratory studies, ordering and review of radiographic studies, pulse oximetry and re-evaluation of patient's condition.   MDM   Patient with status asthmaticus and acute respiratory failure. Intubated. Treated with mag sulfate. No infiltrate on CXR. Continuous Albuterol neb via tube. Case discussed with CCM who will see and admit the patient.     Brandt LoosenJulie Manly, MD 07/16/14 [phone]

## 2014-07-16 NOTE — ED Notes (Signed)
Dr. Delton Coombes back at the bedside, vent settings adjusted. NS bolus infusing of 250 ml.

## 2014-07-16 NOTE — ED Notes (Signed)
MD made aware of vitals. No further orders received at this time.

## 2014-07-16 NOTE — ED Notes (Signed)
Pt bucking the vent. Propofol turned up

## 2014-07-16 NOTE — Code Documentation (Signed)
Pt with ams, very anxious, combabtitive dr Lavella Lemons to intubate pt

## 2014-07-16 NOTE — Code Documentation (Signed)
Preparing from intubation.

## 2014-07-16 NOTE — ED Notes (Signed)
NS bolus finished. Dr. Delton CoombesByrum aware of BP

## 2014-07-16 NOTE — ED Notes (Signed)
Dr. Byrum at the bedside.  

## 2014-07-16 NOTE — H&P (Signed)
PULMONARY / CRITICAL CARE MEDICINE   Name: Ana Clarke MRN: [id number] DOB: 1996/07/22    ADMISSION DATE:  07/16/2014  CHIEF COMPLAINT:  Dyspnea, agitation  INITIAL PRESENTATION:  18 yo woman, hx of asthma, presented to ED 8/22 in distress, agitated, dyspneic. She was intubated for resp support and to allow sedation. PCCM consulted to admit.   STUDIES:  CXR 8/22 >> clear  SIGNIFICANT EVENTS: Intubated in ED 8/22   HISTORY OF PRESENT ILLNESS:  Very little history is available, no family at bedside. 18 yo woman with hx of asthma. Apparently she was at work when she developed agitation and distress, was brought the ED. On initial eval she was unable to interact, was agitated and combative, coughing and in resp distress. She was intubated for resp support and to allow sedation, treated for presumed asthma exacerbation  PAST MEDICAL HISTORY :  Past Medical History  Diagnosis Date  . Asthma    History reviewed. No pertinent past surgical history. Prior to Admission medications   Not on File   Not on File  FAMILY HISTORY:  History reviewed. No pertinent family history. SOCIAL HISTORY:  reports that she has been smoking.  She does not have any smokeless tobacco history on file. She reports that she uses illicit drugs (Marijuana). She reports that she does not drink alcohol.  REVIEW OF SYSTEMS:  Unable to obtain  SUBJECTIVE:  Coughing, still w some agitation on sedation  VITAL SIGNS: Temp:  [95.7 F (35.4 C)-98.1 F (36.7 C)] 96.7 F (35.9 C) (08/22 1204) Pulse Rate:  [77-151] 81 (08/22 1230) Resp:  [14-33] 14 (08/22 1230) BP: (66-135)/(32-92) 104/60 mmHg (08/22 1230) SpO2:  [99 %-100 %] 100 % (08/22 1230) FiO2 (%):  [30 %-100 %] 30 % (08/22 1200) Weight:  [63.504 kg (140 lb)-63.6 kg (140 lb 3.4 oz)] 63.6 kg (140 lb 3.4 oz) (08/22 0900) HEMODYNAMICS:   VENTILATOR SETTINGS: Vent Mode:  [-] PRVC FiO2 (%):  [30 %-100 %] 30 % Set Rate:  [14 bmp-16 bmp] 14 bmp Vt Set:   [450 mL-600 mL] 600 mL PEEP:  [5 cmH20] 5 cmH20 Plateau Pressure:  [13 cmH20-22 cmH20] 13 cmH20 INTAKE / OUTPUT:  Intake/Output Summary (Last 24 hours) at 07/16/14 1247 Last data filed at 07/16/14 1200  Gross per 24 hour  Intake 1044.49 ml  Output    595 ml  Net 449.49 ml    PHYSICAL EXAMINATION: General:  Ill appearing young woman, sedated Neuro:  Agitated, moving UE's and kicking, strong cough,  HEENT:  Pupils pinpoint, freq cough with purulent secretions.  Cardiovascular:  Tachycardic and regular Lungs:  Coarse bilaterally, no overt exp wheeze Abdomen:  Soft, NT, + BS Musculoskeletal:  No deformities Skin:  No rash  LABS:  CBC  Recent Labs Lab 07/16/14 0702  WBC 17.2*  HGB 12.2  HCT 39.3  PLT 259   Coag's No results found for this basename: APTT, INR,  in the last 168 hours BMET  Recent Labs Lab 07/16/14 0702  NA 139  K 4.6  CL 102  CO2 21  BUN 11  CREATININE 0.79  GLUCOSE 187*   Electrolytes  Recent Labs Lab 07/16/14 0702  CALCIUM 8.7   Sepsis Markers No results found for this basename: LATICACIDVEN, PROCALCITON, O2SATVEN,  in the last 168 hours ABG  Recent Labs Lab 07/16/14 1124  PHART 7.385  PCO2ART 34.1*  PO2ART 245.0*   Liver Enzymes No results found for this basename: AST, ALT, ALKPHOS, BILITOT, ALBUMIN,  in the  last 168 hours Cardiac Enzymes No results found for this basename: TROPONINI, PROBNP,  in the last 168 hours Glucose No results found for this basename: GLUCAP,  in the last 168 hours  Imaging No results found.   ASSESSMENT / PLAN:  PULMONARY OETT 8/22 >>  A: acute resp failure Asthma with acute exacerbation ? Upper airway obstruction given her improvement in wheezing after intubation P:   - vent support with care to avoid auto-PEEP > higher volumes, lower rate, longer exp times - solumedrol - scheduled BD's - consider empiric abx given purulent sputum; he CXR is clear - assess for SBT  8/23  CARDIOVASCULAR CVL none A: sinus tachycardia, secondary to above P:  - follow tele  RENAL A:  No acute issues P:   - follow BMP  GASTROINTESTINAL A:  Acid prophylaxis P:   - pepcid ordered  HEMATOLOGIC A:  DVT prophylaxis                   P:  - heparin sq  INFECTIOUS A:  Possible bronchitis P:   BCx2  8/22 > UC 8/22 >  Abx: azithromycin, start date 8/22, day 1/5  ENDOCRINE A:  Hyperglycemia on steroids   P:   - SSI per protocol  NEUROLOGIC A:  Acute encephalopathy, presumed secondary to resp distress. Consider primary encephalopathy (UDS negative) Sedation P:   RASS goal: -2 - sedation for RASS -2 initially to allow adequate ventilation and resp recovery. Will lighten sedation and change RASS goal 8/23  TODAY'S SUMMARY: 18 yo woman with asthma, intubated for resp distress and agitation 8/22.   I have personally obtained a history, examined the patient, evaluated laboratory and imaging results, formulated the assessment and plan and placed orders. CRITICAL CARE: The patient is critically ill with multiple organ systems failure and requires high complexity decision making for assessment and support, frequent evaluation and titration of therapies, application of advanced monitoring technologies and extensive interpretation of multiple databases. Critical Care Time devoted to patient care services described in this note is 60  minutes.   Levy Pupaobert Fatema Rabe, MD, PhD 07/16/2014, 1:17 PM [facility] [phone] or if no answer [phone]

## 2014-07-16 NOTE — ED Notes (Signed)
Patient here by ems for resp distress and anxiety, pt wheezing on arrival and combatitive, per ems, called at work and was screaming to help her, pt took 3 albuterol with no relief at home, pt then with ems given solumedrol and a combivent , with ems sats 50 and came to 100 with bagging and combivent, pt arrived combabtive,

## 2014-07-17 LAB — BLOOD GAS, ARTERIAL
Acid-base deficit: 3.6 mmol/L — ABNORMAL HIGH (ref 0.0–2.0)
Bicarbonate: 20.3 mEq/L (ref 20.0–24.0)
DRAWN BY: 13898
FIO2: 0.3 %
MECHVT: 600 mL
O2 SAT: 98.4 %
PEEP/CPAP: 5 cmH2O
PO2 ART: 107 mmHg — AB (ref 80.0–100.0)
Patient temperature: 97.4
RATE: 14 resp/min
TCO2: 21.3 mmol/L (ref 0–100)
pCO2 arterial: 31.6 mmHg — ABNORMAL LOW (ref 35.0–45.0)
pH, Arterial: 7.419 (ref 7.350–7.450)

## 2014-07-17 LAB — BASIC METABOLIC PANEL
Anion gap: 12 (ref 5–15)
BUN: 8 mg/dL (ref 6–23)
CO2: 21 mEq/L (ref 19–32)
Calcium: 8.4 mg/dL (ref 8.4–10.5)
Chloride: 104 mEq/L (ref 96–112)
Creatinine, Ser: 0.72 mg/dL (ref 0.50–1.10)
GFR calc Af Amer: >90 mL/min (ref >90)
Glucose, Bld: 118 mg/dL — ABNORMAL HIGH (ref 70–99)
Potassium: 4.2 mEq/L (ref 3.7–5.3)
Sodium: 137 mEq/L (ref 137–147)

## 2014-07-17 LAB — CBC
HEMATOCRIT: 34.8 % — AB (ref 36.0–46.0)
HEMOGLOBIN: 11.1 g/dL — AB (ref 12.0–15.0)
MCH: 25.2 pg — ABNORMAL LOW (ref 26.0–34.0)
MCHC: 31.9 g/dL (ref 30.0–36.0)
MCV: 79.1 fL (ref 78.0–100.0)
Platelets: 234 10*3/uL (ref 150–400)
RBC: 4.4 MIL/uL (ref 3.87–5.11)
RDW: 13.8 % (ref 11.5–15.5)
WBC: 8.9 10*3/uL (ref 4.0–10.5)

## 2014-07-17 LAB — MAGNESIUM: Magnesium: 2.2 mg/dL (ref 1.5–2.5)

## 2014-07-17 LAB — PHOSPHORUS: PHOSPHORUS: 4 mg/dL (ref 2.3–4.6)

## 2014-07-17 LAB — GLUCOSE, CAPILLARY
GLUCOSE-CAPILLARY: 123 mg/dL — AB (ref 70–99)
Glucose-Capillary: 107 mg/dL — ABNORMAL HIGH (ref 70–99)
Glucose-Capillary: 119 mg/dL — ABNORMAL HIGH (ref 70–99)
Glucose-Capillary: 139 mg/dL — ABNORMAL HIGH (ref 70–99)
Glucose-Capillary: 157 mg/dL — ABNORMAL HIGH (ref 70–99)

## 2014-07-17 LAB — PROCALCITONIN

## 2014-07-17 MED ORDER — LEVETIRACETAM 500 MG PO TABS: 500.0000 mg | ORAL_TABLET | Freq: Two times a day (BID) | ORAL | Status: DC

## 2014-07-17 MED ORDER — METHYLPREDNISOLONE SODIUM SUCC 125 MG IJ SOLR
60.0000 mg | Freq: Three times a day (TID) | INTRAMUSCULAR | Status: DC
  Administered 2014-07-17: 60 mg via INTRAVENOUS
  Filled 2014-07-17 (×3): qty 0.96

## 2014-07-17 MED ORDER — LEVETIRACETAM 500 MG PO TABS
500.0000 mg | ORAL_TABLET | Freq: Two times a day (BID) | ORAL | Status: DC
  Administered 2014-07-17 – 2014-07-18 (×3): 500 mg via ORAL
  Filled 2014-07-17 (×4): qty 1

## 2014-07-17 MED ORDER — METHYLPREDNISOLONE SODIUM SUCC 125 MG IJ SOLR
60.0000 mg | Freq: Two times a day (BID) | INTRAMUSCULAR | Status: DC
  Administered 2014-07-18: 60 mg via INTRAVENOUS
  Administered 2014-07-18: 05:00:00 via INTRAVENOUS
  Filled 2014-07-17 (×3): qty 0.96

## 2014-07-17 MED ORDER — GUAIFENESIN 100 MG/5ML PO SYRP
400.0000 mg | ORAL_SOLUTION | ORAL | Status: DC | PRN
  Filled 2014-07-17 (×2): qty 20

## 2014-07-17 MED ORDER — CHLORHEXIDINE GLUCONATE CLOTH 2 % EX PADS
6.0000 | MEDICATED_PAD | Freq: Every day | CUTANEOUS | Status: DC
  Administered 2014-07-17 – 2014-07-18 (×2): 6 via TOPICAL

## 2014-07-17 NOTE — Progress Notes (Signed)
Friend at bedside. Anxiety levels down at this time.

## 2014-07-17 NOTE — Progress Notes (Signed)
Spoke with patient about medical history, including Bipolar disorder, anxiety, and depression. Patient states she has not taken any medications for these issues in over two years. States she does not want to harm herself or anyone else, but does express hopelessness and feels overwhelmed and wants to give up. Expresses that she only has one Neb treatment at home and is concerned about getting more. States she was diagnosed with epilepsy in 2014 and has been taking Keppra  BID. Dr. Vassie Loll notified. Will get a social work consult and restart her on all home meds. Will monitor her closely.

## 2014-07-17 NOTE — Progress Notes (Signed)
PULMONARY / CRITICAL CARE MEDICINE   Name: Ana Clarke MRN: [id number] DOB: 1996-02-14    ADMISSION DATE:  07/16/2014  CHIEF COMPLAINT:  Dyspnea, agitation  INITIAL PRESENTATION:  18 yo woman, hx of asthma, presented to ED 8/22 in distress, agitated, dyspneic. She was intubated for resp support and to allow sedation. PCCM consulted to admit.   STUDIES:  CXR 8/22 >> clear  SIGNIFICANT EVENTS: Intubated in ED 8/22   HISTORY OF PRESENT ILLNESS:  Very little history is available, no family at bedside. 18 yo woman with hx of asthma. Apparently she was at work when she developed agitation and distress, was brought the ED. On initial eval she was unable to interact, was agitated and combative, coughing and in resp distress. She was intubated for resp support and to allow sedation, treated for presumed asthma exacerbation  SUBJECTIVE:  Calm , interactive  Peak pr 24  VITAL SIGNS: Temp:  [96.7 F (35.9 C)-99.1 F (37.3 C)] 97.8 F (36.6 C) (08/23 0400) Pulse Rate:  [71-134] 110 (08/23 0700) Resp:  [14-31] 14 (08/23 0700) BP: (78-135)/(40-92) 97/44 mmHg (08/23 0700) SpO2:  [90 %-100 %] 90 % (08/23 0700) FiO2 (%):  [30 %-60 %] 30 % (08/23 0600) Weight:  [58.3 kg (128 lb 8.5 oz)-63.6 kg (140 lb 3.4 oz)] 58.3 kg (128 lb 8.5 oz) (08/23 0400) HEMODYNAMICS:   VENTILATOR SETTINGS: Vent Mode:  [-] PRVC FiO2 (%):  [30 %-60 %] 30 % Set Rate:  [14 bmp] 14 bmp Vt Set:  [600 mL] 600 mL PEEP:  [5 cmH20] 5 cmH20 Plateau Pressure:  [13 cmH20-26 cmH20] 15 cmH20 INTAKE / OUTPUT:  Intake/Output Summary (Last 24 hours) at 07/17/14 0859 Last data filed at 07/17/14 0700  Gross per 24 hour  Intake 1703.21 ml  Output   1685 ml  Net  18.21 ml    PHYSICAL EXAMINATION: General:  Ill appearing young woman, on propofol & fent gtt Neuro:  Interactive, non focal HEENT:  No jvd Cardiovascular:  Tachycardic and regular Lungs:  Clear bilaterally, no overt exp wheeze Abdomen:  Soft, NT, +  BS Musculoskeletal:  No deformities Skin:  No rash  LABS:  CBC  Recent Labs Lab 07/16/14 0702 07/17/14 0236  WBC 17.2* 8.9  HGB 12.2 11.1*  HCT 39.3 34.8*  PLT 259 234   Coag's No results found for this basename: APTT, INR,  in the last 168 hours BMET  Recent Labs Lab 07/16/14 0702 07/17/14 0236  NA 139 137  K 4.6 4.2  CL 102 104  CO2 21 21  BUN 11 8  CREATININE 0.79 0.72  GLUCOSE 187* 118*   Electrolytes  Recent Labs Lab 07/16/14 0702 07/17/14 0236  CALCIUM 8.7 8.4  MG  --  2.2  PHOS  --  4.0   Sepsis Markers  Recent Labs Lab 07/16/14 1255  LATICACIDVEN 0.9  PROCALCITON <0.10   ABG  Recent Labs Lab 07/16/14 1124 07/17/14 0350  PHART 7.385 7.419  PCO2ART 34.1* 31.6*  PO2ART 245.0* 107.0*   Liver Enzymes No results found for this basename: AST, ALT, ALKPHOS, BILITOT, ALBUMIN,  in the last 168 hours Cardiac Enzymes  Recent Labs Lab 07/16/14 1255 07/16/14 1630 07/16/14 2225  TROPONINI <0.30 <0.30 <0.30  PROBNP 30.0  --   --    Glucose  Recent Labs Lab 07/16/14 1322 07/16/14 1651 07/16/14 2006 07/17/14 0005  GLUCAP 128* 169* 117* 139*    Imaging Dg Chest Portable 1 View  07/16/2014   CLINICAL DATA:  Asthma.  EXAM: PORTABLE CHEST - 1 VIEW  COMPARISON:  Same day.  FINDINGS: The heart size and mediastinal contours are within normal limits. No pneumothorax or pleural effusion is noted. Endotracheal tube is in grossly good position with distal tip 7 cm above the carina. Nasogastric tube is seen passing through the esophagus into the stomach. Both lungs are clear. The visualized skeletal structures are unremarkable.  IMPRESSION: Endotracheal tube in grossly good position. No acute cardiopulmonary abnormality seen.   Electronically Signed   By: Roque Lias M.D.   On: 07/16/2014 09:00     ASSESSMENT / PLAN:  PULMONARY OETT 8/22 >>  A: acute resp failure Asthma with acute exacerbation ? Upper airway obstruction given her improvement  in wheezing after intubation P:   - SBTs  With goal extubation - solumedrol 60 q 8h - scheduled BD's - azithro   CARDIOVASCULAR CVL none A: sinus tachycardia, secondary to above P:  - follow tele  RENAL A:  No acute issues P:   - follow BMP  GASTROINTESTINAL A:  Acid prophylaxis P:   - pepcid ordered  HEMATOLOGIC A:  DVT prophylaxis                   P:  - heparin sq  INFECTIOUS A:  Possible bronchitis P:   BCx2  8/22 > UC 8/22 >  Abx: azithromycin, start date 8/22  ENDOCRINE A:  Hyperglycemia on steroids   P:   - SSI per protocol  NEUROLOGIC A:  Acute encephalopathy, presumed secondary to resp distress. Consider primary encephalopathy (UDS negative) Sedation P:   RASS goal: 0   TODAY'S SUMMARY: 18 yo woman with asthma, ready for extubation. Evaluate triiger for this flare post extubation, may need to assess nasal polyps & more history  I have personally obtained a history, examined the patient, evaluated laboratory and imaging results, formulated the assessment and plan and placed orders. CRITICAL CARE: The patient is critically ill with multiple organ systems failure and requires high complexity decision making for assessment and support, frequent evaluation and titration of therapies, application of advanced monitoring technologies and extensive interpretation of multiple databases. Critical Care Time devoted to patient care services described in this note is 32  minutes.   Cyril Mourning MD. Tonny Bollman. [facility] Pager [phone] If no response call [phone]   07/17/2014, 8:59 AM

## 2014-07-17 NOTE — Progress Notes (Signed)
Pt placed on C/PS 5/5 per Dr. Vassie Loll who is at bedside. Pt tolerating well, will continue to monitor and eval for extubation today.

## 2014-07-17 NOTE — Procedures (Signed)
Extubation Procedure Note  Patient Details:   Name: Ana Clarke DOB: 1996-06-09 MRN: [id number]   Airway Documentation:  Airway 7.5 mm (Active)  Secured at (cm) 23 cm 07/17/2014  8:57 AM  Measured From Teeth 07/17/2014  8:57 AM  Secured Location Right 07/16/2014  3:40 PM  Secured By [employer] 07/17/2014  8:57 AM  Tube Holder Repositioned Yes 07/17/2014  8:57 AM  Cuff Pressure (cm H2O) 21 cm H2O 07/16/2014  3:40 PM  Site Condition Dry 07/16/2014  3:40 PM    Evaluation  O2 sats: stable throughout Complications: No apparent complications Patient did tolerate procedure well. Bilateral Breath Sounds: Clear Suctioning: Oral Yes  Pt extubated per Dr. Vassie Loll order at this time, pt had + cuff leak, duoneb tx given post extubation, pt sounds clear and able to voice, no distress noted at this time Cherylin Mylar 07/17/2014, 9:38 AM

## 2014-07-18 DIAGNOSIS — J45902 Unspecified asthma with status asthmaticus: Principal | ICD-10-CM

## 2014-07-18 DIAGNOSIS — J9600 Acute respiratory failure, unspecified whether with hypoxia or hypercapnia: Secondary | ICD-10-CM

## 2014-07-18 LAB — GLUCOSE, CAPILLARY: GLUCOSE-CAPILLARY: 114 mg/dL — AB (ref 70–99)

## 2014-07-18 MED ORDER — AZITHROMYCIN 500 MG PO TABS: 500.0000 mg | ORAL_TABLET | Freq: Every day | ORAL | Status: DC

## 2014-07-18 MED ORDER — MOMETASONE FURO-FORMOTEROL FUM 200-5 MCG/ACT IN AERO: 2.0000 | INHALATION_SPRAY | Freq: Two times a day (BID) | RESPIRATORY_TRACT | Status: DC

## 2014-07-18 MED ORDER — ALBUTEROL SULFATE (2.5 MG/3ML) 0.083% IN NEBU: 2.5000 mg | INHALATION_SOLUTION | Freq: Four times a day (QID) | RESPIRATORY_TRACT | Status: AC | PRN

## 2014-07-18 MED ORDER — PREDNISONE 20 MG PO TABS
40.0000 mg | ORAL_TABLET | Freq: Every day | ORAL | Status: DC
  Administered 2014-07-18: 40 mg via ORAL
  Filled 2014-07-18 (×2): qty 2

## 2014-07-18 MED ORDER — LEVOFLOXACIN 500 MG PO TABS
500.0000 mg | ORAL_TABLET | Freq: Every day | ORAL | Status: DC
  Administered 2014-07-18: 500 mg via ORAL
  Filled 2014-07-18: qty 1

## 2014-07-18 MED ORDER — PREDNISONE 10 MG PO TABS: ORAL_TABLET | ORAL | Status: AC

## 2014-07-18 MED ORDER — MOMETASONE FURO-FORMOTEROL FUM 200-5 MCG/ACT IN AERO: 1.0000 | INHALATION_SPRAY | Freq: Two times a day (BID) | RESPIRATORY_TRACT | Status: AC

## 2014-07-18 MED ORDER — ALBUTEROL SULFATE HFA 108 (90 BASE) MCG/ACT IN AERS: 2.0000 | INHALATION_SPRAY | Freq: Four times a day (QID) | RESPIRATORY_TRACT | Status: AC | PRN

## 2014-07-18 MED ORDER — LEVOFLOXACIN 500 MG PO TABS: 500.0000 mg | ORAL_TABLET | Freq: Every day | ORAL | Status: AC

## 2014-07-18 NOTE — Progress Notes (Signed)
Patient discharged to home accompanied by family. Discharge instructions and rx given and explained and patient stated understanding. IV was removed and patient left unit in a stable condition with all personal belongings via wheelchair.  

## 2014-07-18 NOTE — Progress Notes (Signed)
Utilization review completed.  
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
## 2014-07-18 NOTE — Progress Notes (Addendum)
Much improved Cough - yellow sputum Sputum cx  - strep pneumonia Clear on exam, no rhonchi H/o nasal polyps -had surgery x2, takes advil daily for headaches  Recommend - OK to dc today Pred 10 mg -Take 4 tabs  daily with food x 4 days, then 3 tabs daily x 4 days, then 2 tabs daily x 4 days, then 1 tab daily x4 days then stop. #40 Start dulera 200 1 puff twice daily (maintenance) Albuterol MDI prn (rescue), alb neb Rx will be given NO NSAIDS (motrin, advil, aleve) - take tylenol instead prn headache ENT consult as outpt (may have samter's triad) Complete levaquin x 5ds total (hcg neg) Needs appt with TP in 1 week   Oretha Milch. MD

## 2014-07-18 NOTE — Discharge Summary (Signed)
Physician Discharge Summary       Patient ID: Ana Clarke MRN: [id number] DOB/AGE: Jan 05, 1996 18 y.o.  Admit date: 07/16/2014 Discharge date: 07/18/2014  Discharge Diagnoses:  acute resp failure  Asthma with acute exacerbation  ? Upper airway obstruction given her improvement in wheezing after intubation  Purulent  bronchitis  Acute encephalopathy  Detailed Hospital Course:   Very little history is available, no family at bedside. 18 yo woman with hx of asthma. Apparently she was at work when she developed agitation and distress, was brought the ED. On initial eval she was unable to interact, was agitated and combative, coughing and in resp distress. She was intubated for resp support and to allow sedation, treated for presumed asthma exacerbation.   Was treated in the usual fashion. Empiric antibioitics, scheduled BDs, systemic steroids and supportive care. Culture data was sent and was positive for streptococcus Pneumoniae. Antibiotics were narrowed. She was extubated on 8/23 and moved to the medical ward. As of 8/24 she was stable for d/c with the following plan of care as outlined below.     Discharge Plan by active problems  Asthma with acute exacerbation and Purulent bronchitis (strep Positive) ? 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 #40  Start dulera 200 1 puff twice daily (maintenance)  Albuterol MDI prn (rescue), alb neb Rx will be given  NO NSAIDS (motrin, advil, aleve) - take tylenol instead prn headache  ENT consult as outpt (may have samter's triad) we can refer this thru our office.  Complete levaquin x 5ds total (hcg neg)  Needs appt with TP in 1 week    Significant Hospital tests/ studies  Consults   Discharge Exam: BP 106/65  Pulse 64   Temp(Src) 97.4 F (36.3 C) (Oral)  Resp 16  Ht  (1.727 m)  Wt 60.419 kg (133 lb 3.2 oz)  BMI 20.26 kg/m2  SpO2 98%   Labs at discharge Lab Results  Component Value Date   CREATININE 0.72 07/17/2014   BUN 8 07/17/2014   NA 137 07/17/2014   K 4.2 07/17/2014   CL 104 07/17/2014   CO2 21 07/17/2014   Lab Results  Component Value Date   WBC 8.9 07/17/2014   HGB 11.1* 07/17/2014   HCT 34.8* 07/17/2014   MCV 79.1 07/17/2014   PLT 234 07/17/2014   No results found for this basename: ALT,  AST,  GGT,  ALKPHOS,  BILITOT   No results found for this basename: INR,  PROTIME    Current radiology studies No results found.  Disposition:  Final discharge disposition not confirmed      Discharge Instructions   Diet - low sodium heart healthy    Complete by:  As directed      Discharge instructions    Complete by:  As directed   NO NSAIDS (motrin, advil, aleve) - take tylenol instead prn headache     Increase activity slowly    Complete by:  As directed             Medication List    STOP taking these medications       naproxen sodium 220 MG tablet  Commonly known as:  ANAPROX      TAKE these  medications       albuterol (2.5 MG/3ML) 0.083% nebulizer solution  Commonly known as:  PROVENTIL  Take 2.5 mg by nebulization every 6 (six) hours as needed for wheezing or shortness of breath.     albuterol 108 (90 BASE) MCG/ACT inhaler  Commonly known as:  PROVENTIL HFA;VENTOLIN HFA  Inhale 2 puffs into the lungs every 6 (six) hours as needed for wheezing or shortness of breath.     DAYQUIL PO  Take 1 capsule by mouth daily as needed (for cold).     levETIRAcetam 500 MG tablet  Commonly known as:  KEPPRA  Take 500 mg by mouth 2 (two) times daily.     levofloxacin 500 MG tablet  Commonly known as:  LEVAQUIN  Take 1 tablet (500 mg total) by mouth daily.     mometasone-formoterol 200-5 MCG/ACT Aero  Commonly known as:  DULERA  Inhale 2 puffs into the lungs 2 (two) times  daily.     OVER THE COUNTER MEDICATION  Take 1 tablet by mouth 2 (two) times daily as needed (for congestion). Chest congestion mucus relief     predniSONE 10 MG tablet  Commonly known as:  DELTASONE  Take 4 tabs  daily with food x 4 days, then 3 tabs daily x 4 days, then 2 tabs daily x 4 days, then 1 tab daily x4 days then stop. #40         Discharged Condition: good    Signed: BABCOCK,PETE 07/18/2014, 1:13 PM   Physician Statement:   The Patient was personally examined, the discharge assessment and plan has been personally reviewed and I agree with ACNP Babcock's assessment and plan. > 30 minutes of time have been dedicated to discharge assessment, planning and discharge instructions.   Oretha Milch MD

## 2014-07-19 LAB — CULTURE, RESPIRATORY W GRAM STAIN

## 2014-07-19 LAB — CULTURE, RESPIRATORY

## 2014-07-22 ENCOUNTER — Encounter (HOSPITAL_COMMUNITY): Payer: Self-pay | Admitting: Emergency Medicine

## 2014-07-25 ENCOUNTER — Inpatient Hospital Stay: Payer: TRICARE For Life (TFL) | Admitting: Adult Health

## 2014-11-03 IMAGING — CR DG CHEST 2V
2 series · 2 of 2 positions shown · non-contrast
Comparison: 05/14/2014

CLINICAL DATA: Shortness of breath for 1 week

EXAM:
CHEST  2 VIEW

[w chest pa]
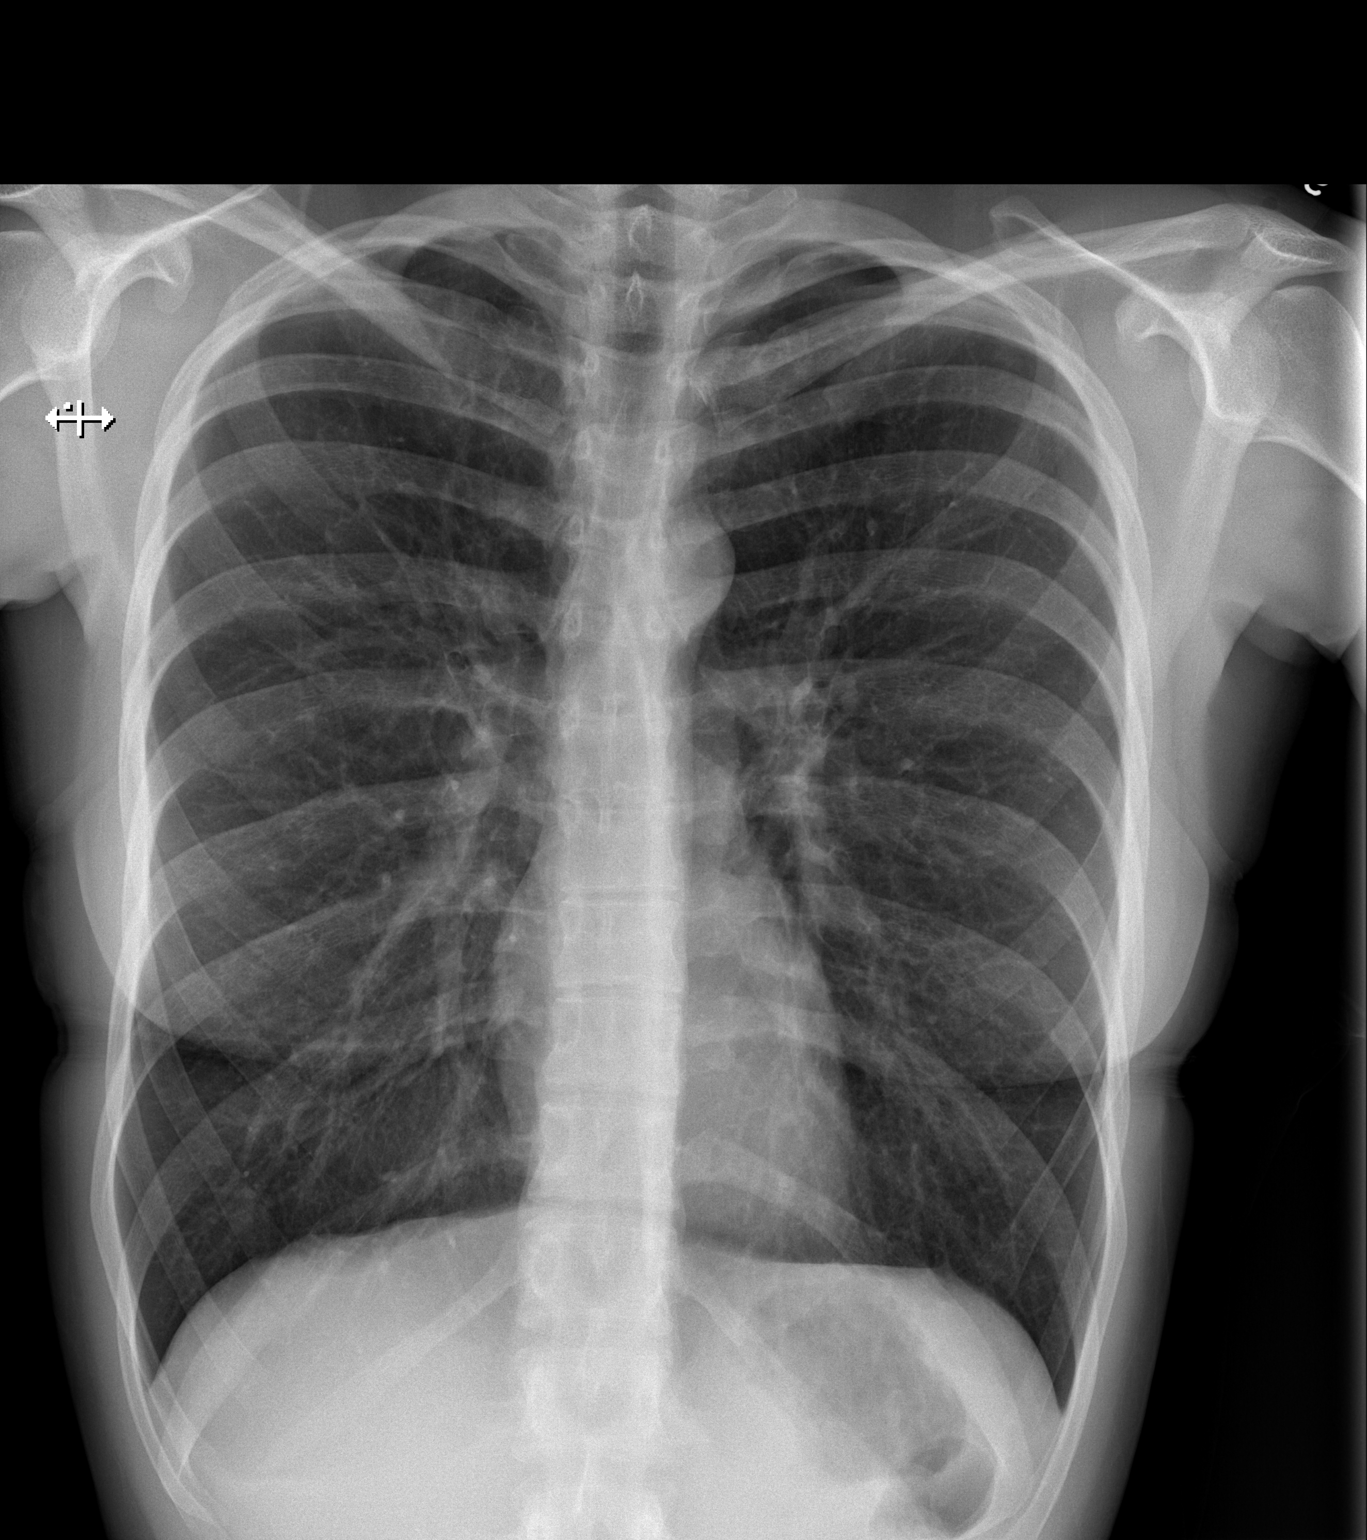

[w chest lat]
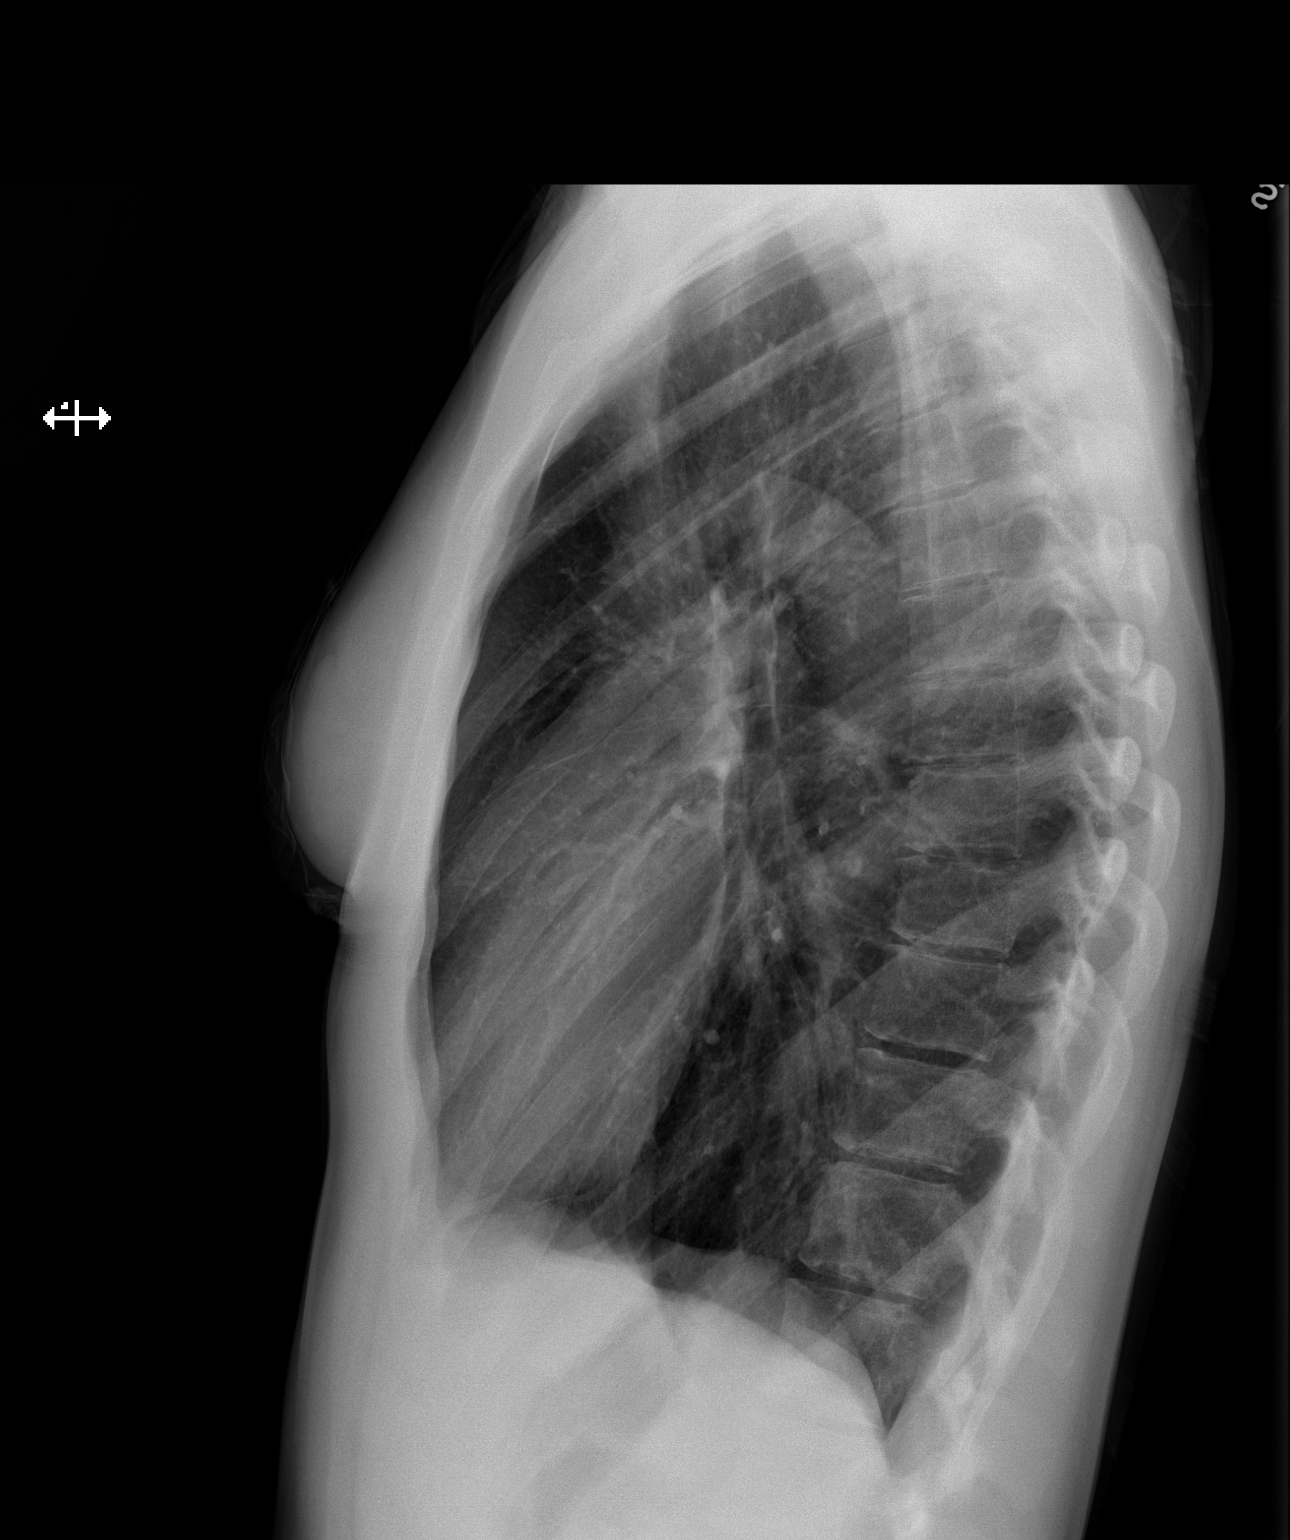

[2 of 2 positions shown; findings below may reference images not displayed]

FINDINGS: The lungs are hyperinflated as can be seen with reactive airway
disease. There is no focal parenchymal opacity, pleural effusion, or
pneumothorax. The heart and mediastinal contours are unremarkable.

The osseous structures are unremarkable.
IMPRESSION: Hyperinflated lungs as can be seen with reactive airway disease.
Otherwise no acute cardiopulmonary disease.

## 2023-02-24 DEATH — deceased
